# Patient Record
Sex: Male | Born: 1960 | Race: Black or African American | Hispanic: No | Marital: Married | Smoking: Never smoker
Health system: Southern US, Community
[De-identification: ages and names within clinical notes are randomized; demographics above are authoritative.]

## PROBLEM LIST (undated history)

## (undated) DIAGNOSIS — B019 Varicella without complication: Secondary | ICD-10-CM

## (undated) DIAGNOSIS — I2510 Atherosclerotic heart disease of native coronary artery without angina pectoris: Secondary | ICD-10-CM

## (undated) DIAGNOSIS — M542 Cervicalgia: Secondary | ICD-10-CM

## (undated) DIAGNOSIS — E7800 Pure hypercholesterolemia, unspecified: Secondary | ICD-10-CM

## (undated) DIAGNOSIS — I10 Essential (primary) hypertension: Secondary | ICD-10-CM

## (undated) HISTORY — PX: OTHER SURGICAL HISTORY: SHX169

## (undated) HISTORY — PX: BYPASS GRAFT: SHX909

## (undated) HISTORY — PX: CORONARY ARTERY BYPASS GRAFT: SHX141

---

## 2004-01-22 ENCOUNTER — Other Ambulatory Visit: Payer: Self-pay

## 2004-05-16 ENCOUNTER — Emergency Department: Payer: Self-pay | Admitting: Emergency Medicine

## 2007-09-29 ENCOUNTER — Ambulatory Visit: Payer: Self-pay | Admitting: Cardiology

## 2007-12-30 ENCOUNTER — Encounter: Payer: Self-pay | Admitting: Internal Medicine

## 2008-01-13 ENCOUNTER — Encounter: Payer: Self-pay | Admitting: Internal Medicine

## 2008-02-13 ENCOUNTER — Encounter: Payer: Self-pay | Admitting: Internal Medicine

## 2008-03-15 ENCOUNTER — Encounter: Payer: Self-pay | Admitting: Internal Medicine

## 2008-04-14 ENCOUNTER — Encounter: Payer: Self-pay | Admitting: Internal Medicine

## 2008-06-22 ENCOUNTER — Ambulatory Visit: Payer: Self-pay | Admitting: Specialist

## 2008-06-30 ENCOUNTER — Ambulatory Visit: Payer: Self-pay | Admitting: Specialist

## 2010-01-19 ENCOUNTER — Emergency Department: Payer: Self-pay | Admitting: Emergency Medicine

## 2010-03-25 ENCOUNTER — Emergency Department: Payer: Self-pay | Admitting: Unknown Physician Specialty

## 2010-04-20 ENCOUNTER — Ambulatory Visit: Payer: Self-pay | Admitting: Gastroenterology

## 2010-04-26 LAB — PATHOLOGY REPORT

## 2011-09-05 ENCOUNTER — Emergency Department: Payer: Self-pay | Admitting: Emergency Medicine

## 2011-09-05 LAB — BASIC METABOLIC PANEL
Anion Gap: 11 (ref 7–16)
BUN: 17 mg/dL (ref 7–18)
Chloride: 107 mmol/L (ref 98–107)
Creatinine: 1.64 mg/dL — ABNORMAL HIGH (ref 0.60–1.30)
EGFR (African American): 58 — ABNORMAL LOW
EGFR (Non-African Amer.): 48 — ABNORMAL LOW
Glucose: 105 mg/dL — ABNORMAL HIGH (ref 65–99)
Osmolality: 289 (ref 275–301)
Potassium: 3.7 mmol/L (ref 3.5–5.1)

## 2011-09-05 LAB — CBC
HGB: 13.8 g/dL (ref 13.0–18.0)
MCV: 88 fL (ref 80–100)
Platelet: 275 10*3/uL (ref 150–440)
RBC: 4.67 10*6/uL (ref 4.40–5.90)
WBC: 7.1 10*3/uL (ref 3.8–10.6)

## 2011-09-06 LAB — CK TOTAL AND CKMB (NOT AT ARMC)
CK, Total: 450 U/L — ABNORMAL HIGH (ref 35–232)
CK-MB: 1.2 ng/mL (ref 0.5–3.6)

## 2011-09-06 LAB — TROPONIN I: Troponin-I: <0.02 ng/mL

## 2013-07-23 ENCOUNTER — Emergency Department: Payer: Self-pay | Admitting: Emergency Medicine

## 2014-01-09 ENCOUNTER — Emergency Department: Payer: Self-pay | Admitting: Emergency Medicine

## 2014-02-07 ENCOUNTER — Emergency Department: Payer: Self-pay | Admitting: Emergency Medicine

## 2014-05-01 ENCOUNTER — Emergency Department: Payer: Self-pay | Admitting: Emergency Medicine

## 2014-08-17 ENCOUNTER — Emergency Department: Payer: Self-pay | Admitting: Emergency Medicine

## 2014-08-17 LAB — CBC
HCT: 41.7 % (ref 40.0–52.0)
HGB: 13.9 g/dL (ref 13.0–18.0)
MCH: 29.1 pg (ref 26.0–34.0)
MCHC: 33.3 g/dL (ref 32.0–36.0)
MCV: 88 fL (ref 80–100)
PLATELETS: 255 10*3/uL (ref 150–440)
RBC: 4.76 10*6/uL (ref 4.40–5.90)
RDW: 14.6 % — ABNORMAL HIGH (ref 11.5–14.5)
WBC: 5.7 10*3/uL (ref 3.8–10.6)

## 2014-08-17 LAB — COMPREHENSIVE METABOLIC PANEL
ALBUMIN: 3.7 g/dL (ref 3.4–5.0)
Alkaline Phosphatase: 58 U/L (ref 46–116)
Anion Gap: 6 — ABNORMAL LOW (ref 7–16)
BUN: 21 mg/dL — AB (ref 7–18)
Bilirubin,Total: 0.4 mg/dL (ref 0.2–1.0)
Calcium, Total: 8.7 mg/dL (ref 8.5–10.1)
Chloride: 106 mmol/L (ref 98–107)
Co2: 29 mmol/L (ref 21–32)
Creatinine: 1.68 mg/dL — ABNORMAL HIGH (ref 0.60–1.30)
EGFR (African American): 56 — ABNORMAL LOW
EGFR (Non-African Amer.): 46 — ABNORMAL LOW
GLUCOSE: 99 mg/dL (ref 65–99)
OSMOLALITY: 284 (ref 275–301)
Potassium: 3.8 mmol/L (ref 3.5–5.1)
SGOT(AST): 21 U/L (ref 15–37)
SGPT (ALT): 21 U/L (ref 14–63)
Sodium: 141 mmol/L (ref 136–145)
TOTAL PROTEIN: 7.3 g/dL (ref 6.4–8.2)

## 2015-01-27 ENCOUNTER — Emergency Department: Payer: Self-pay

## 2015-01-27 ENCOUNTER — Emergency Department
Admission: EM | Admit: 2015-01-27 | Discharge: 2015-01-27 | Disposition: A | Discharge status: Home / Self Care | Payer: Self-pay | Attending: Emergency Medicine | Admitting: Emergency Medicine

## 2015-01-27 DIAGNOSIS — Y9289 Other specified places as the place of occurrence of the external cause: Secondary | ICD-10-CM | POA: Insufficient documentation

## 2015-01-27 DIAGNOSIS — W228XXA Striking against or struck by other objects, initial encounter: Secondary | ICD-10-CM | POA: Insufficient documentation

## 2015-01-27 DIAGNOSIS — Y9389 Activity, other specified: Secondary | ICD-10-CM | POA: Insufficient documentation

## 2015-01-27 DIAGNOSIS — Y990 Civilian activity done for income or pay: Secondary | ICD-10-CM | POA: Insufficient documentation

## 2015-01-27 DIAGNOSIS — M7032 Other bursitis of elbow, left elbow: Secondary | ICD-10-CM | POA: Insufficient documentation

## 2015-01-27 DIAGNOSIS — M7150 Other bursitis, not elsewhere classified, unspecified site: Secondary | ICD-10-CM

## 2015-01-27 MED ORDER — IBUPROFEN 800 MG PO TABS
800.0000 mg | ORAL_TABLET | Freq: Once | ORAL | Status: AC
  Administered 2015-01-27: 800 mg via ORAL
  Filled 2015-01-27: qty 1

## 2015-01-27 MED ORDER — LIDOCAINE-EPINEPHRINE (PF) 1 %-1:200000 IJ SOLN
30.0000 mL | Freq: Once | INTRAMUSCULAR | Status: AC
  Administered 2015-01-27: 30 mL
  Filled 2015-01-27: qty 30

## 2015-01-27 MED ORDER — OXYCODONE-ACETAMINOPHEN 5-325 MG PO TABS: 1.0000 | ORAL_TABLET | Freq: Four times a day (QID) | ORAL | Status: DC | PRN

## 2015-01-27 NOTE — Discharge Instructions (Signed)
Bursitis °Bursitis is a swelling and soreness (inflammation) of a fluid-filled sac (bursa) that overlies and protects a joint. It can be caused by injury, overuse of the joint, arthritis or infection. The joints most likely to be affected are the elbows, shoulders, hips and knees. °HOME CARE INSTRUCTIONS  °· Apply ice to the affected area for 15-20 minutes each hour while awake for 2 days. Put the ice in a plastic bag and place a towel between the bag of ice and your skin. °· Rest the injured joint as much as possible, but continue to put the joint through a full range of motion, 4 times per day. (The shoulder joint especially becomes rapidly "frozen" if not used.) When the pain lessens, begin normal slow movements and usual activities. °· Only take over-the-counter or prescription medicines for pain, discomfort or fever as directed by your caregiver. °· Your caregiver may recommend draining the bursa and injecting medicine into the bursa. This may help the healing process. °· Follow all instructions for follow-up with your caregiver. This includes any orthopedic referrals, physical therapy and rehabilitation. Any delay in obtaining necessary care could result in a delay or failure of the bursitis to heal and chronic pain. °SEEK IMMEDIATE MEDICAL CARE IF:  °· Your pain increases even during treatment. °· You develop an oral temperature above 102° F (38.9° C) and have heat and inflammation over the involved bursa. °MAKE SURE YOU:  °· Understand these instructions. °· Will watch your condition. °· Will get help right away if you are not doing well or get worse. °Document Released: 06/28/2000 Document Revised: 09/23/2011 Document Reviewed: 09/20/2013 °[employer]® Patient Information ©2015 [employer]. This information is not intended to replace advice given to you by your health care provider. Make sure you discuss any questions you have with your health care provider. ° °

## 2015-01-27 NOTE — ED Provider Notes (Signed)
[facility] Emergency Department Provider Note ____________________________________________  Time seen: Approximately  5:35 PM  I have reviewed the triage vital signs and the nursing notes.   HISTORY  Chief Complaint Elbow Pain   HPI Mayer CamelKerry F Scheffel is a 54 y.o. male who presents to the emergency department for left elbow pain that started after he hit it on something while at work. He states the pain and swelling has increased over the past 2 days. It feels "tight" and hurts to bend it or for anyone to just touch it.   History reviewed. No pertinent past medical history.  There are no active problems to display for this patient.   History reviewed. No pertinent past surgical history.  Current Outpatient Rx  Name  Route  Sig  Dispense  Refill  . oxyCODONE-acetaminophen (ROXICET) 5-325 MG per tablet   Oral   Take 1 tablet by mouth every 6 (six) hours as needed.   12 tablet   0     Allergies Review of patient's allergies indicates no known allergies.  No family history on file.  Social History History  Substance Use Topics  . Smoking status: Never Smoker   . Smokeless tobacco: Never Used  . Alcohol Use: No    Review of Systems Constitutional: No recent illness. Eyes: No visual changes. ENT: No sore throat. Cardiovascular: Denies chest pain or palpitations. Respiratory: Denies shortness of breath. Gastrointestinal: No abdominal pain.  Genitourinary: Negative for dysuria. Musculoskeletal: Pain in left elbow. Skin: Negative for rash. Neurological: Negative for headaches, focal weakness or numbness. 10-point ROS otherwise negative.  ____________________________________________   PHYSICAL EXAM:  VITAL SIGNS: ED Triage Vitals  Enc Vitals Group     BP 01/27/15 1724 131/86 mmHg     Pulse Rate 01/27/15 1724 87     Resp 01/27/15 1724 18     Temp 01/27/15 1724 98.3 F (36.8 C)     Temp Source 01/27/15 1724 Oral     SpO2 01/27/15  1724 98 %     Weight 01/27/15 1714 340 lb (154.223 kg)     Height 01/27/15 1714 6\' 2"  (1.88 m)     Head Cir --      Peak Flow --      Pain Score 01/27/15 1715 7     Pain Loc --      Pain Edu? --      Excl. in GC? --     Constitutional: Alert and oriented. Well appearing and in no acute distress. Eyes: Conjunctivae are normal. EOMI. Head: Atraumatic. Nose: No congestion/rhinnorhea. Neck: No stridor.  Respiratory: Normal respiratory effort.   Musculoskeletal: Limited ROM of left elbow due to pain. Boggy bursa present without erythema or increased skin temp. Neurologic:  Normal speech and language. No gross focal neurologic deficits are appreciated. Speech is normal. No gait instability. Skin:  Skin is warm, dry and intact. Atraumatic. No erythema over bursa. Psychiatric: Mood and affect are normal. Speech and behavior are normal.  ____________________________________________   LABS (all labs ordered are listed, but only abnormal results are displayed)  Labs Reviewed - No data to display ____________________________________________  RADIOLOGY X-ray viewed by me. Olecranon spur noted. No acute abnormality ____________________________________________   PROCEDURES  Procedure(s) performed:  After consent was obtained, using sterile technique the left elbow bursa was prepped and  Lidocaine 1% with epinephrine was used as local anesthetic. The bursa was entered and 15 ml's of serosanguaneous colored fluid was withdrawn.  The procedure was well  tolerated.   Watch for fever, or increased swelling or persistent pain in the joint.  ____________________________________________   INITIAL IMPRESSION / ASSESSMENT AND PLAN / ED COURSE  Pertinent labs & imaging results that were available during my care of the patient were reviewed by me and considered in my medical decision making (see chart for details).  Patient was advised to follow up with orthopedics or return to the emergency  department for concerns. He was advised to limit movement of the joint for the next few days. He was advised to return immediately for any sign or concern of infection. ____________________________________________   FINAL CLINICAL IMPRESSION(S) / ED DIAGNOSES  Final diagnoses:  Bursitis due to trauma       Chinita Pester, FNP 01/27/15 1913  Darien Ramus, MD 01/27/15 [phone]

## 2015-01-27 NOTE — ED Notes (Signed)
Pt states he hit he left elbow at work 2 days , states he works at [employer]

## 2015-05-31 ENCOUNTER — Other Ambulatory Visit: Payer: Self-pay | Admitting: Orthopedic Surgery

## 2015-05-31 DIAGNOSIS — M25561 Pain in right knee: Secondary | ICD-10-CM

## 2015-05-31 DIAGNOSIS — M1711 Unilateral primary osteoarthritis, right knee: Secondary | ICD-10-CM

## 2015-06-16 ENCOUNTER — Ambulatory Visit: Admission: RE | Admit: 2015-06-16 | Payer: BLUE CROSS/BLUE SHIELD | Source: Ambulatory Visit

## 2015-06-30 ENCOUNTER — Encounter: Payer: Self-pay | Admitting: *Deleted

## 2015-06-30 ENCOUNTER — Emergency Department
Admission: EM | Admit: 2015-06-30 | Discharge: 2015-07-01 | Disposition: A | Discharge status: Home / Self Care | Payer: BLUE CROSS/BLUE SHIELD | Attending: Emergency Medicine | Admitting: Emergency Medicine

## 2015-06-30 DIAGNOSIS — Y9289 Other specified places as the place of occurrence of the external cause: Secondary | ICD-10-CM | POA: Insufficient documentation

## 2015-06-30 DIAGNOSIS — Z79899 Other long term (current) drug therapy: Secondary | ICD-10-CM | POA: Diagnosis not present

## 2015-06-30 DIAGNOSIS — Y9389 Activity, other specified: Secondary | ICD-10-CM | POA: Diagnosis not present

## 2015-06-30 DIAGNOSIS — N189 Chronic kidney disease, unspecified: Secondary | ICD-10-CM | POA: Diagnosis not present

## 2015-06-30 DIAGNOSIS — S8992XA Unspecified injury of left lower leg, initial encounter: Secondary | ICD-10-CM | POA: Diagnosis present

## 2015-06-30 DIAGNOSIS — Y998 Other external cause status: Secondary | ICD-10-CM | POA: Insufficient documentation

## 2015-06-30 DIAGNOSIS — X58XXXA Exposure to other specified factors, initial encounter: Secondary | ICD-10-CM | POA: Insufficient documentation

## 2015-06-30 DIAGNOSIS — S86812A Strain of other muscle(s) and tendon(s) at lower leg level, left leg, initial encounter: Secondary | ICD-10-CM | POA: Insufficient documentation

## 2015-06-30 DIAGNOSIS — S86912A Strain of unspecified muscle(s) and tendon(s) at lower leg level, left leg, initial encounter: Secondary | ICD-10-CM

## 2015-06-30 LAB — FIBRIN DERIVATIVES D-DIMER (ARMC ONLY): Fibrin derivatives D-dimer (ARMC): 341 (ref 0–499)

## 2015-06-30 NOTE — ED Notes (Signed)
Pt reports sudden onset of left leg pain beginning yesterday. Pt is able to move leg but reports increased pain with doing so as well as increased pain when bearing weight. Pt denies trauma to the leg. 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 Increased tenderness behind knee upon palpation.  Pedal pulse present and strong. Color of leg appropriate.

## 2015-07-01 LAB — BASIC METABOLIC PANEL
Anion gap: 8 (ref 5–15)
BUN: 24 mg/dL — ABNORMAL HIGH (ref 6–20)
CALCIUM: 9.3 mg/dL (ref 8.9–10.3)
CO2: 26 mmol/L (ref 22–32)
CREATININE: 1.86 mg/dL — AB (ref 0.61–1.24)
Chloride: 107 mmol/L (ref 101–111)
GFR calc non Af Amer: 40 mL/min — ABNORMAL LOW (ref >60)
GFR, EST AFRICAN AMERICAN: 46 mL/min — AB (ref >60)
Glucose, Bld: 135 mg/dL — ABNORMAL HIGH (ref 65–99)
Potassium: 3.8 mmol/L (ref 3.5–5.1)
Sodium: 141 mmol/L (ref 135–145)

## 2015-07-01 LAB — CK: Total CK: 527 U/L — ABNORMAL HIGH (ref 49–397)

## 2015-07-01 MED ORDER — ACETAMINOPHEN 500 MG PO TABS
1000.0000 mg | ORAL_TABLET | Freq: Once | ORAL | Status: AC
  Administered 2015-07-01: 1000 mg via ORAL
  Filled 2015-07-01: qty 2

## 2015-07-01 MED ORDER — HYDROCODONE-ACETAMINOPHEN 5-325 MG PO TABS: 1.0000 | ORAL_TABLET | ORAL | Status: DC | PRN

## 2015-07-01 MED ORDER — DOCUSATE SODIUM 100 MG PO CAPS: ORAL_CAPSULE | ORAL | Status: DC

## 2015-07-01 NOTE — ED Notes (Signed)
Pt. Going home with family. 

## 2015-07-01 NOTE — ED Notes (Signed)
Pt reports sudden onset of pain in left lateral and posterior leg beginning yesterday evening.  Pt describes the pain as a soreness.  Pt denies any injury to the area.  Pt reports he did do more walking then normal yesterday.  Patient rates the pain at an 8 out of 10.  Pt reports a hx of a bypass surgery in 2009 - he reports that they removed an artery from the left leg to use in that surgery.  There is mild swelling on the medial knee, with no warmth or redness.

## 2015-07-01 NOTE — ED Provider Notes (Signed)
[facility] Emergency Department Provider Note  ____________________________________________  Time seen: Approximately 12:23 AM  I have reviewed the triage vital signs and the nursing notes.   HISTORY  Chief Complaint Leg Pain    HPI Greg Lowe is a 53 y.o. male with past medical history that includes a bypass graft with a vein harvest from the left leg approximately 7 years ago.  He presents with rapid onset of pain in his left lower extremity starting yesterday.  He describes the pain as a severe ache that starts just above his knee on the anterior aspect of it and wraps around to the back side of the thigh, pain behind the left knee, and less pain in the calf.  He describes it is the way your muscle feels sore after working out.  He said that he has been working out recently but it is only his left leg is sore and not his right.  He has not had any numbness or tingling in the extremity and and it is the same color and not swollen.  The pain is worse with movement and with weightbearing and slightly better with rest.  He denies fever/chills, chest pain, shortness of breath, abdominal pain, nausea/vomiting.   History reviewed. No pertinent past medical history.  There are no active problems to display for this patient.   Past Surgical History  Procedure Laterality Date  . Bypass graft      triple    Current Outpatient Rx  Name  Route  Sig  Dispense  Refill  . rosuvastatin (CRESTOR) 10 MG tablet   Oral   Take 10 mg by mouth daily.         Marland Kitchen docusate sodium (COLACE) 100 MG capsule      Take 1 tablet once or twice daily as needed for constipation while taking narcotic pain medicine   30 capsule   0   . HYDROcodone-acetaminophen (NORCO/VICODIN) 5-325 MG tablet   Oral   Take 1-2 tablets by mouth every 4 (four) hours as needed for moderate pain.   15 tablet   0   . oxyCODONE-acetaminophen (ROXICET) 5-325 MG per tablet   Oral   Take 1 tablet  by mouth every 6 (six) hours as needed.   12 tablet   0     Allergies Review of patient's allergies indicates no known allergies.  History reviewed. No pertinent family history.  Social History Social History  Substance Use Topics  . Smoking status: Never Smoker   . Smokeless tobacco: Never Used  . Alcohol Use: No    Review of Systems Constitutional: No fever/chills Eyes: No visual changes. ENT: No sore throat. Cardiovascular: Denies chest pain. Respiratory: Denies shortness of breath. Gastrointestinal: No abdominal pain.  No nausea, no vomiting.  No diarrhea.  No constipation. Genitourinary: Negative for dysuria. Musculoskeletal: Muscle pain in the left distal thigh, anterior and posterior, pain behind the left knee, and in the left calf. Skin: Negative for rash. Neurological: Negative for headaches, focal weakness or numbness.  10-point ROS otherwise negative.  ____________________________________________   PHYSICAL EXAM:  VITAL SIGNS: ED Triage Vitals  Enc Vitals Group     BP 06/30/15 2205 149/88 mmHg     Pulse Rate 06/30/15 2205 68     Resp 06/30/15 2205 16     Temp 06/30/15 2205 98.1 F (36.7 C)     Temp Source 06/30/15 2205 Oral     SpO2 06/30/15 2205 96 %  Weight 06/30/15 2205 350 lb (158.759 kg)     Height 06/30/15 2205 6' 1.5" (1.867 m)     Head Cir --      Peak Flow --      Pain Score 06/30/15 2205 9     Pain Loc --      Pain Edu? --      Excl. in GC? --     Constitutional: Alert and oriented. Well appearing and in no acute distress. Eyes: Conjunctivae are normal. PERRL. EOMI. Head: Atraumatic. Nose: No congestion/rhinnorhea. Mouth/Throat: Mucous membranes are moist.  Oropharynx non-erythematous. Neck: No stridor.   Cardiovascular: Normal rate, regular rhythm. Grossly normal heart sounds.  Good peripheral circulation.  Normal distal pulses in bilateral feet. Respiratory: Normal respiratory effort.  No retractions. Lungs  CTAB. Gastrointestinal: Soft and nontender. No distention. No abdominal bruits. No CVA tenderness. Musculoskeletal: The patient has moderate to severe tenderness of the left anterior thigh muscles and the posterior thigh as well as the popliteal fossa.  There is mild tenderness to the left calf.  There is no appreciable swelling of the left lower extremity, no skin color changes, and the extremity is warm. Neurologic:  Normal speech and language. No gross focal neurologic deficits are appreciated including in the affected extremity. Skin:  Skin is warm, dry and intact. No rash noted. Psychiatric: Mood and affect are normal. Speech and behavior are normal.  ____________________________________________   LABS (all labs ordered are listed, but only abnormal results are displayed)  Labs Reviewed  BASIC METABOLIC PANEL - Abnormal; Notable for the following:    Glucose, Bld 135 (*)    BUN 24 (*)    Creatinine, Ser 1.86 (*)    GFR calc non Af Amer 40 (*)    GFR calc Af Amer 46 (*)    All other components within normal limits  CK - Abnormal; Notable for the following:    Total CK 527 (*)    All other components within normal limits  FIBRIN DERIVATIVES D-DIMER [facility] ONLY)   ____________________________________________  EKG  ED ECG REPORT I, Greg Lowe, the attending physician, personally viewed and interpreted this ECG.  Date: 07/01/2015 EKG Time: 02:09 Rate: 69 Rhythm: normal sinus rhythm QRS Axis: normal Intervals: normal ST/T Wave abnormalities: normal Conduction Disutrbances: none Narrative Interpretation: unremarkable  ____________________________________________  RADIOLOGY   No results found.  ____________________________________________   PROCEDURES  Procedure(s) performed: None  Critical Care performed: No ____________________________________________   INITIAL IMPRESSION / ASSESSMENT AND PLAN / ED COURSE  Pertinent labs & imaging results that were  available during my care of the patient were reviewed by me and considered in my medical decision making (see chart for details).  The patient is at low risk for DVT and a d-dimer that was obtained while the patient was waiting for an exam room which is within normal limits virtually excludes the possibility of a DVT.  There is no evidence or indication to support an arterial occlusion, aneurysm, or other emergent arterial issue in the left lower extremity.  Common things being common this is most likely musculoskeletal pain/strain.  I will obtain a BMP and a CK, but there is no specific imaging would be helpful at this time other than possibly a CT iliofemoral runoff study, but given the unlikelihood of this being an arterial issue I feel that the risks of the patient's kidneys and the radiation burden is higher than the potential benefits.  I will reassess after the patient's labs come back.  I cannot give the patient any narcotics because he drove himself but I am giving him 1 g of Tylenol.  ----------------------------------------- 3:28 AM on 07/01/2015 -----------------------------------------  The patient has been sleeping comfortably for the last couple of hours.  He does have some chronic renal disease but his creatinine is not significantly elevated over baseline for him.  His CK is slightly elevated which lends credence to the idea of a muscle strain from his recent workouts, but he does not come anywhere close to qualifying for rhabdomyolysis.  I discussed the results with the patient and encouraged him to drink plenty of by mouth fluids and to take over-the-counter Tylenol as needed for his pain.  I will give him a prescription for Norco for severe pain and I gave him the usual precautions.  I encouraged him to follow up with his PCP on Monday.  I gave my usual and customary return precautions.     ____________________________________________  FINAL CLINICAL IMPRESSION(S) / ED  DIAGNOSES  Final diagnoses:  Muscle strain of left lower leg, initial encounter  Chronic renal insufficiency, unspecified stage      NEW MEDICATIONS STARTED DURING THIS VISIT:  New Prescriptions   DOCUSATE SODIUM (COLACE) 100 MG CAPSULE    Take 1 tablet once or twice daily as needed for constipation while taking narcotic pain medicine   HYDROCODONE-ACETAMINOPHEN (NORCO/VICODIN) 5-325 MG TABLET    Take 1-2 tablets by mouth every 4 (four) hours as needed for moderate pain.     Loleta Roseory Jacora Hopkins, MD 07/01/15 [phone]

## 2015-07-01 NOTE — Discharge Instructions (Signed)
As we discussed, the majority of your workup was reassuring, and we believe that your pain is likely result of muscle strain.  Please use over-the-counter Tylenol and follow up with your primary care doctor on Monday to schedule the next available follow-up appointment.  Take Norco as prescribed for severe pain. Do not drink alcohol, drive or participate in any other potentially dangerous activities while taking this medication as it may make you sleepy. Do not take this medication with any other sedating medications, either prescription or over-the-counter. If you were prescribed Percocet or Vicodin, do not take these with acetaminophen (Tylenol) as it is already contained within these medications.   This medication is an opiate (or narcotic) pain medication and can be habit forming.  Use it as little as possible to achieve adequate pain control.  Do not use or use it with extreme caution if you have a history of opiate abuse or dependence.  If you are on a pain contract with your primary care doctor or a pain specialist, be sure to let them know you were prescribed this medication today from the [facility].  This medication is intended for your use only - do not give any to anyone else and keep it in a secure place where nobody else, especially children, have access to it.  It will also cause or worsen constipation, so you may want to consider taking an over-the-counter stool softener while you are taking this medication.  As we also discussed, please drink plenty of clear fluids such as water or Gatorade to stay hydrated.  You have a degree of chronic kidney disease and the need to make sure have plenty of fluids to keep them healthy.  Return to the emergency department with new or worsening symptoms that concern you.   Muscle Strain A muscle strain is an injury that occurs when a muscle is stretched beyond its normal length. Usually a small number of muscle fibers are torn  when this happens. Muscle strain is rated in degrees. First-degree strains have the least amount of muscle fiber tearing and pain. Second-degree and third-degree strains have increasingly more tearing and pain.  Usually, recovery from muscle strain takes 1-2 weeks. Complete healing takes 5-6 weeks.  CAUSES  Muscle strain happens when a sudden, violent force placed on a muscle stretches it too far. This may occur with lifting, sports, or a fall.  RISK FACTORS Muscle strain is especially common in athletes.  SIGNS AND SYMPTOMS At the site of the muscle strain, there may be:  Pain.  Bruising.  Swelling.  Difficulty using the muscle due to pain or lack of normal function. DIAGNOSIS  Your health care provider will perform a physical exam and ask about your medical history. TREATMENT  Often, the best treatment for a muscle strain is resting, icing, and applying cold compresses to the injured area.  HOME CARE INSTRUCTIONS   Use the PRICE method of treatment to promote muscle healing during the first 2-3 days after your injury. The PRICE method involves:  Protecting the muscle from being injured again.  Restricting your activity and resting the injured body part.  Icing your injury. To do this, put ice in a plastic bag. Place a towel between your skin and the bag. Then, apply the ice and leave it on from 15-20 minutes each hour. After the third day, switch to moist heat packs.  Apply compression to the injured area with a splint or elastic bandage. Be careful not to  wrap it too tightly. This may interfere with blood circulation or increase swelling.  Elevate the injured body part above the level of your heart as often as you can.  Only take over-the-counter or prescription medicines for pain, discomfort, or fever as directed by your health care provider.  Warming up prior to exercise helps to prevent future muscle strains. SEEK MEDICAL CARE IF:   You have increasing pain or swelling  in the injured area.  You have numbness, tingling, or a significant loss of strength in the injured area. MAKE SURE YOU:   Understand these instructions.  Will watch your condition.  Will get help right away if you are not doing well or get worse.   This information is not intended to replace advice given to you by your health care provider. Make sure you discuss any questions you have with your health care provider.   Document Released: 07/01/2005 Document Revised: 04/21/2013 Document Reviewed: 01/28/2013 Elsevier Interactive Patient Education 2016 [employer]  Chronic Kidney Disease Chronic kidney disease occurs when the kidneys are damaged over a long period. The kidneys are two organs that lie on either side of the spine between the middle of the back and the front of the abdomen. The kidneys:  Remove wastes and extra water from the blood.  Produce important hormones. These help keep bones strong, regulate blood pressure, and help create red blood cells.  Balance the fluids and chemicals in the blood and tissues. A small amount of kidney damage may not cause problems, but a large amount of damage may make it difficult or impossible for the kidneys to work the way they should. If steps are not taken to slow down the kidney damage or stop it from getting worse, the kidneys may stop working permanently. Most of the time, chronic kidney disease does not go away. However, it can often be controlled, and those with the disease can usually live normal lives. CAUSES The most common causes of chronic kidney disease are diabetes and high blood pressure (hypertension). Chronic kidney disease may also be caused by:  Diseases that cause the kidneys' filters to become inflamed.  Diseases that affect the immune system.  Genetic diseases.  Medicines that damage the kidneys, such as anti-inflammatory medicines.  Poisoning or exposure to toxic substances.  A reoccurring kidney or urinary  infection.  A problem with urine flow. This may be caused by:  Cancer.  Kidney stones.  An enlarged prostate in males. SIGNS AND SYMPTOMS Because the kidney damage in chronic kidney disease occurs slowly, symptoms develop slowly and may not be obvious until the kidney damage becomes severe. A person may have a kidney disease for years without showing any symptoms. Symptoms can include:  Swelling (edema) of the legs, ankles, or feet.  Tiredness (lethargy).  Nausea or vomiting.  Confusion.  Problems with urination, such as:  Decreased urine production.  Frequent urination, especially at night.  Frequent accidents in children who are potty trained.  Muscle twitches and cramps.  Shortness of breath.  Weakness.  Persistent itchiness.  Loss of appetite.  Metallic taste in the mouth.  Trouble sleeping.  Slowed development in children.  Short stature in children. DIAGNOSIS Chronic kidney disease may be detected and diagnosed by tests, including blood, urine, imaging, or kidney biopsy tests. TREATMENT Most chronic kidney diseases cannot be cured. Treatment usually involves relieving symptoms and preventing or slowing the progression of the disease. Treatment may include:  A special diet. You may need to avoid  alcohol and foods thatare salty and high in potassium.  Medicines. These may:  Lower blood pressure.  Relieve anemia.  Relieve swelling.  Protect the bones. HOME CARE INSTRUCTIONS  Follow your prescribed diet. Your health care provider may instruct you to limit daily salt (sodium) and protein intake.  Take medicines only as directed by your health care provider. Do not take any new medicines (prescription, over-the-counter, or nutritional supplements) unless approved by your health care provider. Many medicines can worsen your kidney damage or need to have the dose adjusted.   Quit smoking if you smoke. Talk to your health care provider about a  smoking cessation program.  Keep all follow-up visits as directed by your health care provider.  Monitor your blood pressure.  Start or continue an exercise plan.  Get immunizations as directed by your health care provider.  Take vitamin and mineral supplements as directed by your health care provider. SEEK IMMEDIATE MEDICAL CARE IF:  Your symptoms get worse or you develop new symptoms.  You develop symptoms of end-stage kidney disease. These include:  Headaches.  Abnormally dark or light skin.  Numbness in the hands or feet.  Easy bruising.  Frequent hiccups.  Menstruation stops.  You have a fever.  You have decreased urine production.  You havepain or bleeding when urinating. MAKE SURE YOU:  Understand these instructions.  Will watch your condition.  Will get help right away if you are not doing well or get worse. FOR MORE INFORMATION   American Association of Kidney Patients: ResidentialShow.is  National Kidney Foundation: www.kidney.org  American Kidney Fund: FightingMatch.com.ee  [employer] [facility]: www.lifeoptions.org and www.kidneyschool.org   This information is not intended to replace advice given to you by your health care provider. Make sure you discuss any questions you have with your health care provider.   Document Released: 04/09/2008 Document Revised: 07/22/2014 Document Reviewed: 02/28/2012 Elsevier Interactive Patient Education [employer]

## 2015-07-24 ENCOUNTER — Encounter: Payer: Self-pay | Admitting: *Deleted

## 2015-07-25 ENCOUNTER — Ambulatory Visit
Admission: RE | Admit: 2015-07-25 | Payer: BLUE CROSS/BLUE SHIELD | Source: Ambulatory Visit | Admitting: Gastroenterology

## 2015-07-25 ENCOUNTER — Encounter: Admission: RE | Payer: Self-pay | Source: Ambulatory Visit

## 2015-07-25 HISTORY — DX: Essential (primary) hypertension: I10

## 2015-07-25 HISTORY — DX: Atherosclerotic heart disease of native coronary artery without angina pectoris: I25.10

## 2015-07-25 HISTORY — DX: Varicella without complication: B01.9

## 2015-07-25 HISTORY — DX: Pure hypercholesterolemia, unspecified: E78.00

## 2015-07-25 HISTORY — DX: Cervicalgia: M54.2

## 2015-07-25 SURGERY — COLONOSCOPY WITH PROPOFOL
Anesthesia: General

## 2015-07-28 ENCOUNTER — Other Ambulatory Visit: Payer: Self-pay | Admitting: Internal Medicine

## 2015-07-28 DIAGNOSIS — N183 Chronic kidney disease, stage 3 unspecified: Secondary | ICD-10-CM

## 2015-08-03 ENCOUNTER — Ambulatory Visit: Admission: RE | Admit: 2015-08-03 | Payer: BLUE CROSS/BLUE SHIELD | Source: Ambulatory Visit

## 2015-08-03 ENCOUNTER — Other Ambulatory Visit: Payer: Self-pay | Admitting: Orthopedic Surgery

## 2015-08-03 DIAGNOSIS — M25561 Pain in right knee: Secondary | ICD-10-CM

## 2015-08-03 DIAGNOSIS — M1711 Unilateral primary osteoarthritis, right knee: Secondary | ICD-10-CM

## 2015-08-23 ENCOUNTER — Ambulatory Visit
Admission: RE | Admit: 2015-08-23 | Discharge: 2015-08-23 | Disposition: A | Discharge status: Home / Self Care | Payer: BLUE CROSS/BLUE SHIELD | Source: Ambulatory Visit | Attending: Orthopedic Surgery | Admitting: Orthopedic Surgery

## 2015-08-23 DIAGNOSIS — M1711 Unilateral primary osteoarthritis, right knee: Secondary | ICD-10-CM | POA: Insufficient documentation

## 2015-08-23 DIAGNOSIS — M25561 Pain in right knee: Secondary | ICD-10-CM | POA: Diagnosis present

## 2015-09-18 ENCOUNTER — Encounter: Payer: Self-pay | Admitting: *Deleted

## 2015-09-19 ENCOUNTER — Ambulatory Visit: Payer: BLUE CROSS/BLUE SHIELD | Admitting: Anesthesiology

## 2015-09-19 ENCOUNTER — Encounter: Payer: Self-pay | Admitting: *Deleted

## 2015-09-19 ENCOUNTER — Encounter
Admission: RE | Disposition: A | Discharge status: Home / Self Care | Payer: Self-pay | Source: Ambulatory Visit | Attending: Gastroenterology

## 2015-09-19 ENCOUNTER — Ambulatory Visit
Admission: RE | Admit: 2015-09-19 | Discharge: 2015-09-19 | Disposition: A | Discharge status: Home / Self Care | Payer: BLUE CROSS/BLUE SHIELD | Source: Ambulatory Visit | Attending: Gastroenterology | Admitting: Gastroenterology

## 2015-09-19 DIAGNOSIS — E7800 Pure hypercholesterolemia, unspecified: Secondary | ICD-10-CM | POA: Diagnosis not present

## 2015-09-19 DIAGNOSIS — K5730 Diverticulosis of large intestine without perforation or abscess without bleeding: Secondary | ICD-10-CM | POA: Insufficient documentation

## 2015-09-19 DIAGNOSIS — M542 Cervicalgia: Secondary | ICD-10-CM | POA: Diagnosis not present

## 2015-09-19 DIAGNOSIS — Z7982 Long term (current) use of aspirin: Secondary | ICD-10-CM | POA: Insufficient documentation

## 2015-09-19 DIAGNOSIS — Z86010 Personal history of colonic polyps: Secondary | ICD-10-CM | POA: Diagnosis present

## 2015-09-19 DIAGNOSIS — I10 Essential (primary) hypertension: Secondary | ICD-10-CM | POA: Insufficient documentation

## 2015-09-19 DIAGNOSIS — I2510 Atherosclerotic heart disease of native coronary artery without angina pectoris: Secondary | ICD-10-CM | POA: Diagnosis not present

## 2015-09-19 DIAGNOSIS — Z79899 Other long term (current) drug therapy: Secondary | ICD-10-CM | POA: Diagnosis not present

## 2015-09-19 DIAGNOSIS — Z09 Encounter for follow-up examination after completed treatment for conditions other than malignant neoplasm: Secondary | ICD-10-CM | POA: Insufficient documentation

## 2015-09-19 HISTORY — PX: COLONOSCOPY WITH PROPOFOL: SHX5780

## 2015-09-19 SURGERY — COLONOSCOPY WITH PROPOFOL
Anesthesia: General

## 2015-09-19 MED ORDER — SODIUM CHLORIDE 0.9 % IV SOLN
INTRAVENOUS | Status: DC
Start: 2015-09-19 — End: 2015-09-19

## 2015-09-19 MED ORDER — SODIUM CHLORIDE 0.9 % IV SOLN
INTRAVENOUS | Status: DC
  Administered 2015-09-19: 10:00:00 via INTRAVENOUS

## 2015-09-19 MED ORDER — PROPOFOL 500 MG/50ML IV EMUL
INTRAVENOUS | Status: DC | PRN
  Administered 2015-09-19: 200 ug/kg/min via INTRAVENOUS

## 2015-09-19 MED ORDER — PROPOFOL 10 MG/ML IV BOLUS
INTRAVENOUS | Status: DC | PRN
  Administered 2015-09-19: 100 mg via INTRAVENOUS

## 2015-09-19 MED ORDER — LIDOCAINE HCL (CARDIAC) 20 MG/ML IV SOLN
INTRAVENOUS | Status: DC | PRN
  Administered 2015-09-19: 60 mg via INTRAVENOUS

## 2015-09-19 NOTE — Anesthesia Preprocedure Evaluation (Signed)
Anesthesia Evaluation  Patient identified by MRN, date of birth, ID band Patient awake    Reviewed: Allergy & Precautions, H&P , NPO status , Patient's Chart, lab work & pertinent test results, reviewed documented beta blocker date and time   History of Anesthesia Complications Negative for: history of anesthetic complications  Airway Mallampati: IV  TM Distance: >3 FB Neck ROM: full    Dental no notable dental hx. (+) Missing, Teeth Intact   Pulmonary neg pulmonary ROS,    Pulmonary exam normal breath sounds clear to auscultation       Cardiovascular Exercise Tolerance: Good hypertension, On Medications (-) angina+ CAD and + CABG (3 vessel CABG in 2009)  (-) Past MI and (-) Cardiac Stents Normal cardiovascular exam(-) dysrhythmias (-) Valvular Problems/Murmurs Rhythm:regular Rate:Normal     Neuro/Psych negative neurological ROS  negative psych ROS   GI/Hepatic negative GI ROS, Neg liver ROS,   Endo/Other  neg diabetesMorbid obesity  Renal/GU negative Renal ROS  negative genitourinary   Musculoskeletal   Abdominal   Peds  Hematology negative hematology ROS (+)   Anesthesia Other Findings Past Medical History:   Coronary artery disease                                      Hypertension                                                 High cholesterol                                             Chicken pox                                                  Cervicalgia                                                  Reproductive/Obstetrics negative OB ROS                             Anesthesia Physical Anesthesia Plan  ASA: III  Anesthesia Plan: General   Post-op Pain Management:    Induction:   Airway Management Planned:   Additional Equipment:   Intra-op Plan:   Post-operative Plan:   Informed Consent: I have reviewed the patients History and Physical, chart, labs and  discussed the procedure including the risks, benefits and alternatives for the proposed anesthesia with the patient or authorized representative who has indicated his/her understanding and acceptance.   Dental Advisory Given  Plan Discussed with: Anesthesiologist, CRNA and Surgeon  Anesthesia Plan Comments:         Anesthesia Quick Evaluation

## 2015-09-19 NOTE — Transfer of Care (Signed)
Immediate Anesthesia Transfer of Care Note  Patient: Greg Lowe  Procedure(s) Performed: Procedure(s): COLONOSCOPY WITH PROPOFOL (N/A)  Patient Location: PACU and Endoscopy Unit  Anesthesia Type:General  Level of Consciousness: awake  Airway & Oxygen Therapy: Patient Spontanous Breathing and Patient connected to nasal cannula oxygen  Post-op Assessment: Report given to RN and Post -op Vital signs reviewed and stable  Post vital signs: stable  Last Vitals:  Filed Vitals:   09/19/15 0953 09/19/15 1222  BP: 167/96 120/52  Pulse: 59 61  Temp: 36.6 C 36.3 C  Resp: 20 14    Complications: No apparent anesthesia complications

## 2015-09-19 NOTE — H&P (Signed)
Outpatient short stay form Pre-procedure 09/19/2015 11:37 AM Christena DeemMartin U Skulskie MD  Primary Physician: Dr. Bethann PunchesMark Miller  Reason for visit:  Colonoscopy  History of present illness:  Patient is a 55 year old male presenting today for a colonoscopy. He has a personal history of adenomatous colon polyps. His last colonoscopy was 04/20/2010. He does take a 325 mg aspirin but has held that for 5 days. He takes no other aspirin products or blood thinning agents.    Current facility-administered medications:  .  0.9 %  sodium chloride infusion, , Intravenous, Continuous, Christena DeemMartin U Skulskie, MD, Last Rate: 20 mL/hr at 09/19/15 1023 .  0.9 %  sodium chloride infusion, , Intravenous, Continuous, Christena DeemMartin U Skulskie, MD  Prescriptions prior to admission  Medication Sig Dispense Refill Last Dose  . losartan-hydrochlorothiazide (HYZAAR) 100-25 MG tablet Take 1 tablet by mouth daily.   09/19/2015 at Unknown time  . rosuvastatin (CRESTOR) 10 MG tablet Take 10 mg by mouth daily.   09/19/2015 at Unknown time  . aspirin 325 MG tablet Take 325 mg by mouth daily.     . colchicine 0.6 MG tablet Take 0.6 mg by mouth daily.     Marland Kitchen. docusate sodium (COLACE) 100 MG capsule Take 1 tablet once or twice daily as needed for constipation while taking narcotic pain medicine 30 capsule 0   . HYDROcodone-acetaminophen (NORCO/VICODIN) 5-325 MG tablet Take 1-2 tablets by mouth every 4 (four) hours as needed for moderate pain. 15 tablet 0   . metoprolol succinate (TOPROL-XL) 25 MG 24 hr tablet Take 25 mg by mouth daily. Reported on 09/19/2015   Not Taking at Unknown time  . Multiple Vitamin (MULTIVITAMIN) capsule Take 1 capsule by mouth daily.     Marland Kitchen. oxyCODONE-acetaminophen (ROXICET) 5-325 MG per tablet Take 1 tablet by mouth every 6 (six) hours as needed. 12 tablet 0      No Known Allergies   Past Medical History  Diagnosis Date  . Coronary artery disease   . Hypertension   . High cholesterol   . Chicken pox   . Cervicalgia      Review of systems:      Physical Exam    Heart and lungs: Regular rate and rhythm without rub or gallop, lungs are bilaterally clear.    HEENT: Normocephalic atraumatic eyes are anicteric    Other:     Pertinant exam for procedure: Obese nontender bowel sounds positive is not possible to palpate internal organs.    Planned proceedures: Colonoscopy and indicated procedures. I have discussed the risks benefits and complications of procedures to include not limited to bleeding, infection, perforation and the risk of sedation and the patient wishes to proceed. Christena DeemMartin U Skulskie, MD Gastroenterology 09/19/2015  11:37 AM

## 2015-09-19 NOTE — Op Note (Signed)
[facility] Patient Name: Greg Lowe Record Procedure Date: 09/19/2015 11:51 AM MRN: [id number] Account #: [id number] Date of Birth: 11-Jun-1961 Admit Type: Outpatient Age: 55 Room: [facility] ENDO ROOM 3 Gender: Male Note Status: Finalized Procedure:            Colonoscopy Indications:          Personal history of colonic polyps Providers:            Christena Deem, MD Referring MD:         Danella Penton, MD (Referring MD) Medicines:            Monitored Anesthesia Care Complications:        No immediate complications. Procedure:            Pre-Anesthesia Assessment:                       - ASA Grade Assessment: III - A patient with severe                        systemic disease.                       After obtaining informed consent, the colonoscope was                        passed under direct vision. Throughout the procedure,                        the patient's blood pressure, pulse, and oxygen                        saturations were monitored continuously. The                        Colonoscope was introduced through the anus and                        advanced to the the cecum, identified by appendiceal                        orifice and ileocecal valve. The patient tolerated the                        procedure well. The quality of the bowel preparation                        was good. Findings:      A few small-mouthed diverticula were found in the sigmoid colon.      The retroflexed view of the distal rectum and anal verge was normal and       showed no anal or rectal abnormalities. Rectal vasculature is prominant.      The digital rectal exam was normal. Pertinent negatives include       posterior midline ;irregularity c/w healed fissure. Impression:           - Diverticulosis in the sigmoid colon.                       - No specimens collected. Recommendation:       - Discharge patient to home.                       -  Repeat colonoscopy  in 5 years for surveillance. Procedure Code(s):    --- Professional ---                       [id number], Colonoscopy, flexible; diagnostic, including                        collection of specimen(s) by brushing or washing, when                        performed (separate procedure) Diagnosis Code(s):    --- Professional ---                       Z86.010, Personal history of colonic polyps                       K57.30, Diverticulosis of large intestine without                        perforation or abscess without bleeding CPT copyright 2016 American Medical Association. All rights reserved. The codes documented in this report are preliminary and upon coder review may  be revised to meet current compliance requirements. Christena DeemMartin U Skulskie, MD 09/19/2015 12:17:35 PM This report has been signed electronically. Number of Addenda: 0 Note Initiated On: 09/19/2015 11:51 AM Scope Withdrawal Time: 0 hours 6 minutes 51 seconds  Total Procedure Duration: 0 hours 15 minutes 5 seconds       [facility]

## 2015-09-20 ENCOUNTER — Encounter: Payer: Self-pay | Admitting: Gastroenterology

## 2015-09-21 NOTE — Anesthesia Postprocedure Evaluation (Signed)
Anesthesia Post Note  Patient: Greg Lowe  Procedure(s) Performed: Procedure(s) (LRB): COLONOSCOPY WITH PROPOFOL (N/A)  Patient location during evaluation: Endoscopy Anesthesia Type: General Level of consciousness: awake and alert Pain management: pain level controlled Vital Signs Assessment: post-procedure vital signs reviewed and stable Respiratory status: spontaneous breathing, nonlabored ventilation, respiratory function stable and patient connected to nasal cannula oxygen Cardiovascular status: blood pressure returned to baseline and stable Postop Assessment: no signs of nausea or vomiting Anesthetic complications: no    Last Vitals:  Filed Vitals:   09/19/15 1240 09/19/15 1250  BP: 140/83 153/91  Pulse: 56 55  Temp:    Resp: 18 20    Last Pain:  Filed Vitals:   09/20/15 0749  PainSc: 0-No pain                 Lenard SimmerAndrew Sairah Knobloch

## 2016-02-28 ENCOUNTER — Emergency Department
Admission: EM | Admit: 2016-02-28 | Discharge: 2016-02-28 | Disposition: A | Discharge status: Home / Self Care | Payer: Worker's Compensation | Attending: Student | Admitting: Student

## 2016-02-28 ENCOUNTER — Emergency Department: Payer: BLUE CROSS/BLUE SHIELD

## 2016-02-28 DIAGNOSIS — Y93F2 Activity, caregiving, lifting: Secondary | ICD-10-CM | POA: Insufficient documentation

## 2016-02-28 DIAGNOSIS — Z951 Presence of aortocoronary bypass graft: Secondary | ICD-10-CM | POA: Insufficient documentation

## 2016-02-28 DIAGNOSIS — X500XXA Overexertion from strenuous movement or load, initial encounter: Secondary | ICD-10-CM | POA: Insufficient documentation

## 2016-02-28 DIAGNOSIS — Y929 Unspecified place or not applicable: Secondary | ICD-10-CM | POA: Insufficient documentation

## 2016-02-28 DIAGNOSIS — Y990 Civilian activity done for income or pay: Secondary | ICD-10-CM | POA: Insufficient documentation

## 2016-02-28 DIAGNOSIS — I10 Essential (primary) hypertension: Secondary | ICD-10-CM | POA: Insufficient documentation

## 2016-02-28 DIAGNOSIS — S46912A Strain of unspecified muscle, fascia and tendon at shoulder and upper arm level, left arm, initial encounter: Secondary | ICD-10-CM | POA: Insufficient documentation

## 2016-02-28 DIAGNOSIS — Z79899 Other long term (current) drug therapy: Secondary | ICD-10-CM | POA: Insufficient documentation

## 2016-02-28 DIAGNOSIS — I2510 Atherosclerotic heart disease of native coronary artery without angina pectoris: Secondary | ICD-10-CM | POA: Insufficient documentation

## 2016-02-28 DIAGNOSIS — Z7982 Long term (current) use of aspirin: Secondary | ICD-10-CM | POA: Insufficient documentation

## 2016-02-28 MED ORDER — TRAMADOL HCL 50 MG PO TABS: 50.0000 mg | ORAL_TABLET | Freq: Four times a day (QID) | ORAL | 0 refills | Status: DC | PRN

## 2016-02-28 NOTE — Discharge Instructions (Signed)
Follow-up doctor if any continued problems. Take tramadol as directed.

## 2016-02-28 NOTE — ED Triage Notes (Addendum)
Patient ambulatory to triage with steady gait, without difficulty or distress noted; pt reports left shoulder pain since yesterday after lifting bleachers; st pain increases with ROM

## 2016-02-28 NOTE — ED Notes (Signed)
States he was moving some bleachers at work yesterday  Developed some soreness to left shoulder   Pain with movement  No deformity

## 2016-02-28 NOTE — ED Provider Notes (Signed)
[facility] Emergency Department Provider Note  ____________________________________________   First MD Initiated Contact with Patient 02/28/16 [phone]     (approximate)  I have reviewed the triage vital signs and the nursing notes.   HISTORY  Chief Complaint Shoulder Pain   HPI Greg Lowe is a 55 y.o. male see a complaint of left shoulder pain. Patient states he was lifting bleachers yesterday and initially did not have any pain however several hours later he began having pain. He denies any known injury other than lifting. Patient is not taking any over-the-counter medications at home last evening or this morning for his pain. He denies any previous injury to shoulder. Patient states that his pain is increased with positioning his arm above his head.He was told by his primary care doctor not to take anti-inflammatories for kidney problems. Currently he rates his pain as an 8 out of 10.   Past Medical History:  Diagnosis Date  . Cervicalgia   . Chicken pox   . Coronary artery disease   . High cholesterol   . Hypertension     There are no active problems to display for this patient.   Past Surgical History:  Procedure Laterality Date  . BYPASS GRAFT     triple  . COLONOSCOPY WITH PROPOFOL N/A 09/19/2015   Procedure: COLONOSCOPY WITH PROPOFOL;  Surgeon: Christena DeemMartin U Skulskie, MD;  [facility];  Service: Endoscopy;  Laterality: N/A;  . CORONARY ARTERY BYPASS GRAFT    . right forearm surgery      Prior to Admission medications   Medication Sig Start Date End Date Taking? Authorizing Provider  carvedilol (COREG) 25 MG tablet Take 25 mg by mouth 2 (two) times daily with a meal.   Yes Historical Provider, MD  aspirin 325 MG tablet Take 325 mg by mouth daily.    Historical Provider, MD  colchicine 0.6 MG tablet Take 0.6 mg by mouth daily.    Historical Provider, MD  losartan-hydrochlorothiazide (HYZAAR) 100-25 MG tablet Take 1 tablet by  mouth daily.    Historical Provider, MD  Multiple Vitamin (MULTIVITAMIN) capsule Take 1 capsule by mouth daily.    Historical Provider, MD  rosuvastatin (CRESTOR) 10 MG tablet Take 10 mg by mouth daily.    Historical Provider, MD  traMADol (ULTRAM) 50 MG tablet Take 1 tablet (50 mg total) by mouth every 6 (six) hours as needed. 02/28/16   Tommi Rumpshonda L Summers, PA-C    Allergies Review of patient's allergies indicates no known allergies.  No family history on file.  Social History Social History  Substance Use Topics  . Smoking status: Never Smoker  . Smokeless tobacco: Never Used  . Alcohol use No    Review of Systems Constitutional: No fever/chills ENT: No sore throat. Cardiovascular: Denies chest pain. Respiratory: Denies shortness of breath. Gastrointestinal: No abdominal pain.  No nausea, no vomiting.   Musculoskeletal: Negative for back pain.Positive for left shoulder pain. Skin: Negative for rash. Neurological: Negative for headaches, focal weakness or numbness.  10-point ROS otherwise negative.  ____________________________________________   PHYSICAL EXAM:  VITAL SIGNS: ED Triage Vitals  Enc Vitals Group     BP 02/28/16 0658 (!) 158/93     Pulse Rate 02/28/16 0658 (!) 55     Resp 02/28/16 0658 18     Temp 02/28/16 0658 98 F (36.7 C)     Temp Source 02/28/16 0658 Oral     SpO2 02/28/16 0658 98 %  Weight 02/28/16 0658 (!) 340 lb (154.2 kg)     Height 02/28/16 0658 6\' 2"  (1.88 m)     Head Circumference --      Peak Flow --      Pain Score 02/28/16 0656 8     Pain Loc --      Pain Edu? --      Excl. in GC? --     Constitutional: Alert and oriented. Well appearing and in no acute distress. Eyes: Conjunctivae are normal. PERRL. EOMI. Head: Atraumatic. Nose: No congestion/rhinnorhea. Neck: No stridor.  No cervical tenderness on palpation posteriorly. Cardiovascular: Normal rate, regular rhythm. Grossly normal heart sounds.  Good peripheral  circulation. Respiratory: Normal respiratory effort.  No retractions. Lungs CTAB. Gastrointestinal: Soft and nontender. No distention.  Musculoskeletal: Examination of the left shoulder there is no gross deformity. There is some tenderness on palpation of the acromioclavicular joint and generalized muscles in the area. No crepitus was noted. Patient had no restrictions with range of motion. Neurologic:  Normal speech and language. No gross focal neurologic deficits are appreciated. No gait instability. Skin:  Skin is warm, dry and intact. No rash noted. No erythema, abrasions or ecchymosis was noted. Psychiatric: Mood and affect are normal. Speech and behavior are normal.  ____________________________________________   LABS (all labs ordered are listed, but only abnormal results are displayed)  Labs Reviewed - No data to display  RADIOLOGY  X-ray of the left shoulder was negative for fracture or dislocation per radiologist. Beaulah CorinI, Rhonda L Summers, personally viewed and evaluated these images (plain radiographs) as part of my medical decision making, as well as reviewing the written report by the radiologist. ____________________________________________   PROCEDURES  Procedure(s) performed: None  Procedures  Critical Care performed: No  ____________________________________________   INITIAL IMPRESSION / ASSESSMENT AND PLAN / ED COURSE  Pertinent labs & imaging results that were available during my care of the patient were reviewed by me and considered in my medical decision making (see chart for details).    Clinical Course  Patient was reassured that he did not have any bone injury to his shoulder. Patient was told by his PCP to avoid NSAIDs due to kidney issues. Patient was given a prescription for tramadol 50 mg 1 every 6 hours as needed for pain. He is also encouraged to follow-up with his primary care doctor if any continued problems with his  shoulder.   ____________________________________________   FINAL CLINICAL IMPRESSION(S) / ED DIAGNOSES  Final diagnoses:  Left shoulder strain, initial encounter      NEW MEDICATIONS STARTED DURING THIS VISIT:  Discharge Medication List as of 02/28/2016  8:14 AM    START taking these medications   Details  traMADol (ULTRAM) 50 MG tablet Take 1 tablet (50 mg total) by mouth every 6 (six) hours as needed., Starting Wed 02/28/2016, Print         Note:  This document was prepared using Dragon voice recognition software and may include unintentional dictation errors.    Tommi Rumpshonda L Summers, PA-C 02/28/16 1124    Gayla DossEryka A Gayle, MD 02/28/16 [phone]

## 2016-12-21 ENCOUNTER — Encounter: Payer: Self-pay | Admitting: Emergency Medicine

## 2016-12-21 ENCOUNTER — Emergency Department: Payer: No Typology Code available for payment source

## 2016-12-21 ENCOUNTER — Emergency Department
Admission: EM | Admit: 2016-12-21 | Discharge: 2016-12-21 | Disposition: A | Discharge status: Home / Self Care | Payer: No Typology Code available for payment source | Attending: Emergency Medicine | Admitting: Emergency Medicine

## 2016-12-21 DIAGNOSIS — I10 Essential (primary) hypertension: Secondary | ICD-10-CM | POA: Insufficient documentation

## 2016-12-21 DIAGNOSIS — M25561 Pain in right knee: Secondary | ICD-10-CM | POA: Diagnosis present

## 2016-12-21 DIAGNOSIS — I2510 Atherosclerotic heart disease of native coronary artery without angina pectoris: Secondary | ICD-10-CM | POA: Diagnosis not present

## 2016-12-21 DIAGNOSIS — Z79899 Other long term (current) drug therapy: Secondary | ICD-10-CM | POA: Insufficient documentation

## 2016-12-21 DIAGNOSIS — M109 Gout, unspecified: Secondary | ICD-10-CM | POA: Diagnosis not present

## 2016-12-21 DIAGNOSIS — Z7982 Long term (current) use of aspirin: Secondary | ICD-10-CM | POA: Diagnosis not present

## 2016-12-21 DIAGNOSIS — Z951 Presence of aortocoronary bypass graft: Secondary | ICD-10-CM | POA: Insufficient documentation

## 2016-12-21 DIAGNOSIS — M25461 Effusion, right knee: Secondary | ICD-10-CM | POA: Diagnosis not present

## 2016-12-21 LAB — SYNOVIAL CELL COUNT + DIFF, W/ CRYSTALS
LYMPHOCYTES-SYNOVIAL FLD: 3 %
Monocyte-Macrophage-Synovial Fluid: 4 %
Neutrophil, Synovial: 93 %
WBC, Synovial: 28959 /mm3 — ABNORMAL HIGH (ref 0–200)

## 2016-12-21 MED ORDER — LIDOCAINE HCL (PF) 1 % IJ SOLN
INTRAMUSCULAR | Status: AC
  Filled 2016-12-21: qty 5

## 2016-12-21 MED ORDER — LIDOCAINE HCL (PF) 1 % IJ SOLN
2.0000 mL | Freq: Once | INTRAMUSCULAR | Status: AC
  Administered 2016-12-21: 2 mL

## 2016-12-21 MED ORDER — LIDOCAINE HCL (PF) 1 % IJ SOLN
2.0000 mL | Freq: Once | INTRAMUSCULAR | Status: AC
  Administered 2016-12-21: 2 mL
  Filled 2016-12-21: qty 5

## 2016-12-21 MED ORDER — TRIAMCINOLONE ACETONIDE 40 MG/ML IJ SUSP
40.0000 mg | Freq: Once | INTRAMUSCULAR | Status: AC
  Administered 2016-12-21: 40 mg via INTRA_ARTICULAR
  Filled 2016-12-21: qty 1

## 2016-12-21 MED ORDER — BUPIVACAINE HCL (PF) 0.5 % IJ SOLN
INTRAMUSCULAR | Status: AC
  Filled 2016-12-21: qty 30

## 2016-12-21 MED ORDER — OXYCODONE-ACETAMINOPHEN 5-325 MG PO TABS
1.0000 | ORAL_TABLET | Freq: Once | ORAL | Status: AC
Start: 2016-12-21 — End: 2016-12-21
  Administered 2016-12-21: 1 via ORAL
  Filled 2016-12-21: qty 1

## 2016-12-21 MED ORDER — BUPIVACAINE HCL 0.5 % IJ SOLN
5.0000 mL | Freq: Once | INTRAMUSCULAR | Status: AC
  Administered 2016-12-21: 5 mL
  Filled 2016-12-21: qty 5

## 2016-12-21 NOTE — ED Triage Notes (Signed)
R knee pain x 5 days, began while lifting weights.

## 2016-12-21 NOTE — Discharge Instructions (Signed)
Please rest ice and elevate the right knee. Use crutches as needed for ambulation. Follow-up with orthopedics if no improvement in 3-4 days. Return to the ER for any worsening symptoms urgent changes in her health.

## 2016-12-21 NOTE — ED Provider Notes (Signed)
[facility]-EMERGENCY DEPARTMENT Provider Note   CSN: [id number] Arrival date & time: 12/21/16  0840     History   Chief Complaint Chief Complaint  Patient presents with  . Knee Pain    HPI Greg Lowe is a 56 y.o. male presents to the emergency department for evaluation of severe right knee pain and swelling. Patient states 6 days ago he performed a vigorous workout with a lot of squatting exercises. He had some mild pain and swelling. Was seen by PCP to attempted aspiration with inability to remove fluid from the joint. Patient states today the pain became very severe, 10 out of 10 he developed a lot of tightness and swelling throughout the right knee. He has a history of gout. He's been taking ibuprofen with no relief. Patient took indomethacin this morning. He denies any fevers. No chest pain, shortness of breathP, Headache, vision changes. tient's pain is located throughout the suprapatellar region, he describes this area is very tight, he is unable to bend his knee.  HPI  Past Medical History:  Diagnosis Date  . Cervicalgia   . Chicken pox   . Coronary artery disease   . High cholesterol   . Hypertension     There are no active problems to display for this patient.   Past Surgical History:  Procedure Laterality Date  . BYPASS GRAFT     triple  . COLONOSCOPY WITH PROPOFOL N/A 09/19/2015   Procedure: COLONOSCOPY WITH PROPOFOL;  Surgeon: Christena Deem, MD;  Location: [facility] ENDOSCOPY;  Service: Endoscopy;  Laterality: N/A;  . CORONARY ARTERY BYPASS GRAFT    . right forearm surgery         Home Medications    Prior to Admission medications   Medication Sig Start Date End Date Taking? Authorizing Provider  aspirin 325 MG tablet Take 325 mg by mouth daily.    [provider]  carvedilol (COREG) 25 MG tablet Take 25 mg by mouth 2 (two) times daily with a meal.    [provider]  colchicine 0.6 MG tablet Take 0.6 mg by mouth daily.    [provider]  losartan-hydrochlorothiazide (HYZAAR) 100-25 MG tablet Take 1 tablet by mouth daily.    [provider]  Multiple Vitamin (MULTIVITAMIN) capsule Take 1 capsule by mouth daily.    [provider]  rosuvastatin (CRESTOR) 10 MG tablet Take 10 mg by mouth daily.    [provider]  traMADol (ULTRAM) 50 MG tablet Take 1 tablet (50 mg total) by mouth every 6 (six) hours as needed. 02/28/16   Tommi Rumps, PA-C    Family History No family history on file.  Social History Social History  Substance Use Topics  . Smoking status: Never Smoker  . Smokeless tobacco: Never Used  . Alcohol use No     Allergies   Patient has no known allergies.   Review of Systems Review of Systems  Constitutional: Negative.  Negative for activity change, appetite change, chills and fever.  HENT: Negative for congestion, ear pain, mouth sores, rhinorrhea, sinus pressure, sore throat and trouble swallowing.   Eyes: Negative for photophobia, pain and discharge.  Respiratory: Negative for cough, chest tightness and shortness of breath.   Cardiovascular: Negative for chest pain and leg swelling.  Gastrointestinal: Negative for abdominal distention, abdominal pain, diarrhea, nausea and vomiting.  Genitourinary: Negative for difficulty urinating and dysuria.  Musculoskeletal: Positive for arthralgias and joint swelling. Negative for back pain and gait problem.  Skin: Negative for color change and rash.  Neurological: Negative for dizziness and headaches.  Hematological: Negative for adenopathy.  Psychiatric/Behavioral: Negative for agitation and behavioral problems.     Physical Exam Updated Vital Signs BP (!) 185/100 (BP Location: Right Arm)   Pulse 70   Temp 98.5 F (36.9 C) (Oral)   Resp 18   Ht 6\' 2"  (1.88 m)   Wt (!) 154.2 kg (340 lb)   SpO2 99%   BMI 43.65 kg/m   Physical Exam  Constitutional: He is oriented to person, place, and time. He appears  well-developed and well-nourished.  HENT:  Head: Normocephalic and atraumatic.  Eyes: Conjunctivae are normal.  Neck: Normal range of motion.  Cardiovascular: Normal rate.   Pulmonary/Chest: Effort normal. No respiratory distress.  Musculoskeletal:  Examination of the right knee shows patient is able to straight leg raise. He has a negative logroll test. He has good ankle plantarflexion dorsiflexion right lower straight. There is no edema or swelling throughout the lower leg. He has a negative Homans sign. He has moderate to severe knee effusion of the right knee with inability to flex the knee past 15 of flexion. There is no warmth or redness. Knee is tender to palpation along the suprapatellar region. Stable to valgus and varus stress testing  Neurological: He is alert and oriented to person, place, and time.  Skin: Skin is warm and dry. No rash noted. No erythema.  Psychiatric: He has a normal mood and affect. His behavior is normal. Judgment and thought content normal.     ED Treatments / Results  Labs (all labs ordered are listed, but only abnormal results are displayed) Labs Reviewed  SYNOVIAL CELL COUNT + DIFF, W/ CRYSTALS - Abnormal; Notable for the following:       Result Value   Appearance-Synovial TURBID (*)    WBC, Synovial 28,959 (*)    All other components within normal limits  BODY FLUID CULTURE    EKG  EKG Interpretation None       Radiology Dg Knee Complete 4 Views Right  Result Date: 12/21/2016 CLINICAL DATA:  Right knee pain and swelling for 4 days. No known injury. EXAM: RIGHT KNEE - COMPLETE 4+ VIEW COMPARISON:  MRI right knee 08/23/2015. Plain films right knee 05/01/2014. FINDINGS: No acute bony or joint abnormality is identified. Moderate to moderately large joint effusion is seen. Spurring about the patella is noted. Joint spaces are preserved. IMPRESSION: Moderate to moderately large joint effusion. Negative for fracture. Unchanged spurring about the  patella at the quadriceps and patellar tendon attachments. Electronically Signed   By: Drusilla Kannerhomas  Dalessio M.D.   On: 12/21/2016 09:47    Procedures Procedures (including critical care time) Right knee aspiration: The patient agreed and said to her right knee aspiration. Skin was prepped with chlorhexidine and alcohol, he was then anesthetized with 3 cc of 1% lidocaine. He is and then injected with a 18-gauge needle and 60 cc syringe, 85 cc of cloudy synovial fluid was aspirated from the right knee. Patient tolerated procedure well. Patient saw improvement with pain. Ace wrap was applied.   Right knee cortisone injection: After cell count and cultures returned indicating gout, no joint infection. Patient agreed and consented to a right knee intra-articular cortisone injection. Skin was cleansed with chlorhexidine alcohol, patient was injected with a bolus solution into the right knee  joint of 3 cc of 1% lidocaine, 3 cc of 0.5% bupivacaine, 1 cc of Kenalog 40 mg. Patient tolerated the  procedure well. Ace wrap was reapplied.  Medications Ordered in ED Medications  lidocaine (PF) (XYLOCAINE) 1 % injection 2 mL (not administered)  triamcinolone acetonide (KENALOG-40) injection 40 mg (not administered)  lidocaine (PF) (XYLOCAINE) 1 % injection 2 mL (not administered)  bupivacaine (MARCAINE) 0.5 % (with pres) injection 5 mL (not administered)  lidocaine (PF) (XYLOCAINE) 1 % injection (not administered)  bupivacaine (MARCAINE) 0.5 % injection (not administered)  oxyCODONE-acetaminophen (PERCOCET/ROXICET) 5-325 MG per tablet 1 tablet (1 tablet Oral Given 12/21/16 0924)     Initial Impression / Assessment and Plan / ED Course  I have reviewed the triage vital signs and the nursing notes.  Pertinent labs & imaging results that were available during my care of the patient were reviewed by me and considered in my medical decision making (see chart for details).    56 year old male with right knee  effusion, gout flare up to the right knee. He tolerated aspiration and injection of cortisone well. There is no sign of septic joint. Fluid analysis indicated crystals throughout the right knee consistent with gout patient will follow-up with PCP if no improvement in 3-4 days. He is educated on signs and symptoms return to the ED for.  Final Clinical Impressions(s) / ED Diagnoses   Final diagnoses:  Acute gout of right knee, unspecified cause  Knee effusion, right    New Prescriptions New Prescriptions   No medications on file     Ronnette Juniper 12/21/16 1305    Myrna Blazer, MD 12/21/16 [phone]

## 2016-12-24 LAB — BODY FLUID CULTURE: CULTURE: NO GROWTH

## 2017-02-22 ENCOUNTER — Emergency Department: Payer: No Typology Code available for payment source

## 2017-02-22 ENCOUNTER — Encounter: Payer: Self-pay | Admitting: Emergency Medicine

## 2017-02-22 ENCOUNTER — Emergency Department
Admission: EM | Admit: 2017-02-22 | Discharge: 2017-02-22 | Disposition: A | Discharge status: Home / Self Care | Payer: No Typology Code available for payment source | Attending: Emergency Medicine | Admitting: Emergency Medicine

## 2017-02-22 DIAGNOSIS — I10 Essential (primary) hypertension: Secondary | ICD-10-CM | POA: Insufficient documentation

## 2017-02-22 DIAGNOSIS — M25572 Pain in left ankle and joints of left foot: Secondary | ICD-10-CM | POA: Diagnosis present

## 2017-02-22 DIAGNOSIS — Z79899 Other long term (current) drug therapy: Secondary | ICD-10-CM | POA: Diagnosis not present

## 2017-02-22 DIAGNOSIS — I25810 Atherosclerosis of coronary artery bypass graft(s) without angina pectoris: Secondary | ICD-10-CM | POA: Diagnosis not present

## 2017-02-22 MED ORDER — PREDNISONE 50 MG PO TABS: ORAL_TABLET | ORAL | 0 refills | Status: DC

## 2017-02-22 MED ORDER — INDOMETHACIN 50 MG PO CAPS
50.0000 mg | ORAL_CAPSULE | Freq: Once | ORAL | Status: AC
  Administered 2017-02-22: 50 mg via ORAL
  Filled 2017-02-22: qty 1

## 2017-02-22 MED ORDER — INDOMETHACIN 50 MG PO CAPS: 50.0000 mg | ORAL_CAPSULE | Freq: Two times a day (BID) | ORAL | 0 refills | Status: AC

## 2017-02-22 NOTE — ED Notes (Signed)
Called pharmacy to send Indomethacin to [facility]

## 2017-02-22 NOTE — ED Provider Notes (Signed)
[facility] Emergency Department Provider Note  ____________________________________________  Time seen: Approximately 9:22 PM  I have reviewed the triage vital signs and the nursing notes.   HISTORY  Chief Complaint Ankle Pain    HPI Greg Lowe is a 56 y.o. male presenting to the emergency department with left ankle pain for the past 2 days. Patient states that he was working outside 2 days ago and noticed worsening ankle pain. He currently rates his left ankle pain at 10 out of 10 in intensity. He has noticed left ankle swelling. Patient has a history of gout and states that his current symptoms feel similar to prior gout flares in the past although patient typically has gout in his right knee and right hand. Patient denies falls or mechanisms of trauma. He denies weakness, radiculopathy or changes in sensation of the left lower extremity. Patient denies a history of septic joints. States that he sustained a left ankle fracture "several years ago".   Past Medical History:  Diagnosis Date  . Cervicalgia   . Chicken pox   . Coronary artery disease   . High cholesterol   . Hypertension     There are no active problems to display for this patient.   Past Surgical History:  Procedure Laterality Date  . BYPASS GRAFT     triple  . COLONOSCOPY WITH PROPOFOL N/A 09/19/2015   Procedure: COLONOSCOPY WITH PROPOFOL;  Surgeon: Christena DeemMartin U Skulskie, MD;  [facility];  Service: Endoscopy;  Laterality: N/A;  . CORONARY ARTERY BYPASS GRAFT    . right forearm surgery      Prior to Admission medications   Medication Sig Start Date End Date Taking? Authorizing Provider  allopurinol (ZYLOPRIM) 100 MG tablet Take 300 mg by mouth daily.   Yes [provider]  aspirin 325 MG tablet Take 325 mg by mouth daily.   Yes [provider]  carvedilol (COREG) 25 MG tablet Take 50 mg by mouth 2 (two) times daily with a meal.    Yes [provider]  Multiple Vitamin (MULTIVITAMIN) capsule Take 1 capsule by mouth daily.   Yes [provider]  rosuvastatin (CRESTOR) 10 MG tablet Take 10 mg by mouth daily.   Yes [provider]  saw palmetto 160 MG capsule Take 160 mg by mouth 2 (two) times daily.   Yes [provider]  indomethacin (INDOCIN) 50 MG capsule Take 1 capsule (50 mg total) by mouth 2 (two) times daily with a meal. 02/22/17 03/01/17  Orvil FeilWoods, Dante Cooter M, PA-C  predniSONE (DELTASONE) 50 MG tablet Take 1 tablet by mouth for the next 5 days. 02/22/17   Orvil FeilWoods, Johnnie Goynes M, PA-C    Allergies Patient has no known allergies.  History reviewed. No pertinent family history.  Social History Social History  Substance Use Topics  . Smoking status: Never Smoker  . Smokeless tobacco: Never Used  . Alcohol use No     Review of Systems  Constitutional: No fever/chills Eyes: No visual changes. No discharge ENT: No upper respiratory complaints. Cardiovascular: no chest pain. Respiratory: no cough. No SOB. Musculoskeletal: She has left ankle pain. Skin: Negative for rash, abrasions, lacerations, ecchymosis. Neurological: Negative for headaches, focal weakness or numbness.   ____________________________________________   PHYSICAL EXAM:  VITAL SIGNS: ED Triage Vitals  Enc Vitals Group     BP 02/22/17 2007 (!) 174/97     Pulse Rate 02/22/17 2007 71     Resp 02/22/17 2007 19  Temp 02/22/17 2007 98.4 F (36.9 C)     Temp Source 02/22/17 2007 Oral     SpO2 02/22/17 2007 98 %     Weight 02/22/17 2005 (!) 340 lb (154.2 kg)     Height 02/22/17 2005 6' 2.5" (1.892 m)     Head Circumference --      Peak Flow --      Pain Score 02/22/17 2005 10     Pain Loc --      Pain Edu? --      Excl. in GC? --      Constitutional: Alert and oriented. Well appearing and in no acute distress. Eyes: Conjunctivae are normal. PERRL. EOMI. Head: Atraumatic. Cardiovascular: Normal rate, regular rhythm.  Normal S1 and S2.  Good peripheral circulation. Respiratory: Normal respiratory effort without tachypnea or retractions. Lungs CTAB. Good air entry to the bases with no decreased or absent breath sounds. Musculoskeletal:Patient has 5 out of 5 strength in the lower extremities bilaterally. Patient is able to move all 5 left toes. Patient is able to perform limited flexion and extension at the left ankle, likely secondary to pain. Palpable dorsalis pedis pulses, left Neurologic:  Normal speech and language. No gross focal neurologic deficits are appreciated.  Skin:  Skin is warm, dry and intact. No rash noted. Psychiatric: Mood and affect are normal. Speech and behavior are normal. Patient exhibits appropriate insight and judgement.   ____________________________________________   LABS (all labs ordered are listed, but only abnormal results are displayed)  Labs Reviewed - No data to display ____________________________________________  EKG   ____________________________________________  RADIOLOGY Geraldo Pitter, personally viewed and evaluated these images (plain radiographs) as part of my medical decision making, as well as reviewing the written report by the radiologist  .Dg Ankle Complete Left  Result Date: 02/22/2017 CLINICAL DATA:  Left ankle pain and swelling. EXAM: LEFT ANKLE COMPLETE - 3+ VIEW COMPARISON:  Foot radiographs from 01/19/2010 FINDINGS: There is no evidence of fracture, dislocation, or joint effusion. Degenerative joint space narrowing and spurring across the dorsum of the midfoot, in particular the navicular-cuneiform articulations as well as joint space narrowing of the tibiotalar joint medially with minimal spurring off the medial malleolus are identified consistent with osteoarthritis. Soft tissue swelling is seen over the dorsum of the midfoot. Plantar calcaneal enthesophyte is noted. Vascular calcifications consistent phleboliths are seen along the medial and  posterior aspect of the distal left leg. IMPRESSION: 1. Tibiotalar and midfoot osteoarthritis. 2. Small plantar calcaneal enthesophyte. 3. Mild soft tissue swelling of the dorsum of the midfoot without underlying fracture or bone destruction. No joint dislocations. Electronically Signed   By: Tollie Eth M.D.   On: 02/22/2017 20:52    ____________________________________________    PROCEDURES  Procedure(s) performed:    Procedures    Medications  indomethacin (INDOCIN) capsule 50 mg (not administered)     ____________________________________________   INITIAL IMPRESSION / ASSESSMENT AND PLAN / ED COURSE  Pertinent labs & imaging results that were available during my care of the patient were reviewed by me and considered in my medical decision making (see chart for details).  Review of the [place] CSRS was performed in accordance of the [facility] prior to dispensing any controlled drugs.     Assessment and plan Left ankle pain Patient presents to the emergency Department with left ankle pain that has progressively worsened over the past 2 days. DG left ankle reveals arthritic changes. Differential diagnosis includes gout versus arthritis. Patient was  given Indocin in the emergency department. He was discharged with Indocin. Patient was also discharged with a prescription for prednisone. Patient was advised to not take Indocin and prednisone together and patient voiced understanding regarding this recommendation. Patient was advised to start prednisone if Indocin does not provide him symptomatic relief. A referral was made to podiatry, Dr. Orland Jarred all patient questions were answered.     ____________________________________________  FINAL CLINICAL IMPRESSION(S) / ED DIAGNOSES  Final diagnoses:  Acute left ankle pain      NEW MEDICATIONS STARTED DURING THIS VISIT:  New Prescriptions   INDOMETHACIN (INDOCIN) 50 MG CAPSULE    Take 1 capsule (50 mg total) by mouth 2 (two) times  daily with a meal.   PREDNISONE (DELTASONE) 50 MG TABLET    Take 1 tablet by mouth for the next 5 days.        This chart was dictated using voice recognition software/Dragon. Despite best efforts to proofread, errors can occur which can change the meaning. Any change was purely unintentional.    Orvil Feil, PA-C 02/22/17 2128    Sharyn Creamer, MD 02/23/17 [phone]

## 2017-02-22 NOTE — ED Notes (Signed)
Patient wheeled to car by this RN.

## 2017-02-22 NOTE — ED Triage Notes (Signed)
Pt says he was working outside Thursday when started having left ankle pain; Friday swelling increased; denies actual injury that he recalls; pt increases with ambulation; pt using cane to assist the last few days

## 2018-05-30 ENCOUNTER — Other Ambulatory Visit: Payer: Self-pay

## 2018-05-30 ENCOUNTER — Emergency Department: Payer: PRIVATE HEALTH INSURANCE

## 2018-05-30 ENCOUNTER — Emergency Department
Admission: EM | Admit: 2018-05-30 | Discharge: 2018-05-30 | Disposition: A | Discharge status: Home / Self Care | Payer: PRIVATE HEALTH INSURANCE | Attending: Emergency Medicine | Admitting: Emergency Medicine

## 2018-05-30 ENCOUNTER — Encounter: Payer: Self-pay | Admitting: Emergency Medicine

## 2018-05-30 DIAGNOSIS — S83401A Sprain of unspecified collateral ligament of right knee, initial encounter: Secondary | ICD-10-CM | POA: Diagnosis not present

## 2018-05-30 DIAGNOSIS — I10 Essential (primary) hypertension: Secondary | ICD-10-CM | POA: Diagnosis not present

## 2018-05-30 DIAGNOSIS — Y929 Unspecified place or not applicable: Secondary | ICD-10-CM | POA: Insufficient documentation

## 2018-05-30 DIAGNOSIS — Y999 Unspecified external cause status: Secondary | ICD-10-CM | POA: Insufficient documentation

## 2018-05-30 DIAGNOSIS — Z79899 Other long term (current) drug therapy: Secondary | ICD-10-CM | POA: Insufficient documentation

## 2018-05-30 DIAGNOSIS — I2510 Atherosclerotic heart disease of native coronary artery without angina pectoris: Secondary | ICD-10-CM | POA: Diagnosis not present

## 2018-05-30 DIAGNOSIS — Z951 Presence of aortocoronary bypass graft: Secondary | ICD-10-CM | POA: Insufficient documentation

## 2018-05-30 DIAGNOSIS — M25461 Effusion, right knee: Secondary | ICD-10-CM | POA: Insufficient documentation

## 2018-05-30 DIAGNOSIS — W502XXA Accidental twist by another person, initial encounter: Secondary | ICD-10-CM | POA: Diagnosis not present

## 2018-05-30 DIAGNOSIS — Y939 Activity, unspecified: Secondary | ICD-10-CM | POA: Insufficient documentation

## 2018-05-30 DIAGNOSIS — S8991XA Unspecified injury of right lower leg, initial encounter: Secondary | ICD-10-CM | POA: Diagnosis present

## 2018-05-30 MED ORDER — TRAMADOL HCL 50 MG PO TABS: 50.0000 mg | ORAL_TABLET | Freq: Four times a day (QID) | ORAL | 0 refills | Status: AC | PRN

## 2018-05-30 MED ORDER — IBUPROFEN 600 MG PO TABS: 600.0000 mg | ORAL_TABLET | Freq: Three times a day (TID) | ORAL | 0 refills | Status: DC | PRN

## 2018-05-30 NOTE — Discharge Instructions (Signed)
Follow discharge care instruction take medication as directed.  Follow-up with orthopedic clinic if no improvement in 5 days.

## 2018-05-30 NOTE — ED Provider Notes (Signed)
[facility] Emergency Department Provider Note   ____________________________________________   First MD Initiated Contact with Patient 05/30/18 1226     (approximate)  I have reviewed the triage vital signs and the nursing notes.   HISTORY  Chief Complaint Knee Injury    HPI Greg Lowe is a 57 y.o. male patient complain of right knee pain secondary to a twisting incident 3 days ago.  Patient states since the twisting incident has continued pain which increased with weightbearing.  Patient rates pain as 8/10.  Patient described pain is "ache".  No palliative measure for complaint.  Past Medical History:  Diagnosis Date  . Cervicalgia   . Chicken pox   . Coronary artery disease   . High cholesterol   . Hypertension     There are no active problems to display for this patient.   Past Surgical History:  Procedure Laterality Date  . BYPASS GRAFT     triple  . COLONOSCOPY WITH PROPOFOL N/A 09/19/2015   Procedure: COLONOSCOPY WITH PROPOFOL;  Surgeon: Christena Deem, MD;  Location: [facility] ENDOSCOPY;  Service: Endoscopy;  Laterality: N/A;  . CORONARY ARTERY BYPASS GRAFT    . right forearm surgery      Prior to Admission medications   Medication Sig Start Date End Date Taking? Authorizing Provider  allopurinol (ZYLOPRIM) 100 MG tablet Take 300 mg by mouth daily.    [provider]  aspirin 325 MG tablet Take 325 mg by mouth daily.    [provider]  carvedilol (COREG) 25 MG tablet Take 50 mg by mouth 2 (two) times daily with a meal.     [provider]  ibuprofen (ADVIL,MOTRIN) 600 MG tablet Take 1 tablet (600 mg total) by mouth every 8 (eight) hours as needed. 05/30/18   Joni Reining, PA-C  Multiple Vitamin (MULTIVITAMIN) capsule Take 1 capsule by mouth daily.    [provider]  predniSONE (DELTASONE) 50 MG tablet Take 1 tablet by mouth for the next 5 days. 02/22/17   Orvil Feil, PA-C  rosuvastatin  (CRESTOR) 10 MG tablet Take 10 mg by mouth daily.    [provider]  saw palmetto 160 MG capsule Take 160 mg by mouth 2 (two) times daily.    [provider]  traMADol (ULTRAM) 50 MG tablet Take 1 tablet (50 mg total) by mouth every 6 (six) hours as needed. 05/30/18 05/30/19  Joni Reining, PA-C    Allergies Patient has no known allergies.  No family history on file.  Social History Social History   Tobacco Use  . Smoking status: Never Smoker  . Smokeless tobacco: Never Used  Substance Use Topics  . Alcohol use: No  . Drug use: No    Review of Systems Constitutional: No fever/chills Eyes: No visual changes. ENT: No sore throat. Cardiovascular: Denies chest pain. Respiratory: Denies shortness of breath. Gastrointestinal: No abdominal pain.  No nausea, no vomiting.  No diarrhea.  No constipation. Genitourinary: Negative for dysuria. Musculoskeletal: Right knee pain. Skin: Negative for rash. Neurological: Negative for headaches, focal weakness or numbness. Endocrine:Hyperlipidemia and hypertension. ____________________________________________   PHYSICAL EXAM:  VITAL SIGNS: ED Triage Vitals [05/30/18 1220]  Enc Vitals Group     BP (!) 154/87     Pulse Rate (!) 57     Resp 18     Temp 98.1 F (36.7 C)     Temp Source Oral     SpO2 98 %  Weight (!) 340 lb (154.2 kg)     Height 6' 2.5" (1.892 m)     Head Circumference      Peak Flow      Pain Score 8     Pain Loc      Pain Edu?      Excl. in GC?     Constitutional: Alert and oriented. Well appearing and in no acute distress. Cardiovascular: Normal rate, regular rhythm. Grossly normal heart sounds.  Good peripheral circulation. Respiratory: Normal respiratory effort.  No retractions. Lungs CTAB. Musculoskeletal: No lower extremity tenderness nor edema.  No joint effusions. Neurologic:  Normal speech and language. No gross focal neurologic deficits are appreciated. No gait  instability. Skin:  Skin is warm, dry and intact. No rash noted. Psychiatric: Mood and affect are normal. Speech and behavior are normal.  ____________________________________________   LABS (all labs ordered are listed, but only abnormal results are displayed)  Labs Reviewed - No data to display ____________________________________________  EKG   ____________________________________________  RADIOLOGY  ED MD interpretation:    Official radiology report(s): Dg Knee Complete 4 Views Right  Result Date: 05/30/2018 CLINICAL DATA:  PT arrived with complaints of medial right knee pain. Pt states she had mechanical injury that caused pt to turn wrong on Tuesday and has has pain in his right knee since. Swelling. Fluid drained 2458yr ago. EXAM: RIGHT KNEE - COMPLETE 4+ VIEW COMPARISON:  12/21/2016 FINDINGS: No evidence of fracture, dislocation. Probable effusion in suprapatellar bursa. Traction spurs from the patella. No evidence of arthropathy or other focal bone abnormality. Soft tissues are unremarkable. IMPRESSION: 1. Negative for fracture or dislocation. 2. Probable joint effusion. Electronically Signed   By: Corlis Leak  Hassell M.D.   On: 05/30/2018 13:00    ____________________________________________   PROCEDURES  Procedure(s) performed: None  Procedures  Critical Care performed: No  ____________________________________________   INITIAL IMPRESSION / ASSESSMENT AND PLAN / ED COURSE  As part of my medical decision making, I reviewed the following data within the electronic MEDICAL RECORD NUMBER    Right knee pain secondary to sprain.  Discussed x-ray findings with patient.  Patient placed in knee immobilizer given discharge care instructions.  Patient advised take medication as directed and follow-up with orthopedic clinic if no improvement in 5 days.      ____________________________________________   FINAL CLINICAL IMPRESSION(S) / ED DIAGNOSES  Final diagnoses:  Sprain of  collateral ligament of right knee, initial encounter  Effusion of right knee     ED Discharge Orders         Ordered    traMADol (ULTRAM) 50 MG tablet  Every 6 hours PRN     05/30/18 1319    ibuprofen (ADVIL,MOTRIN) 600 MG tablet  Every 8 hours PRN     05/30/18 1319           Note:  This document was prepared using Dragon voice recognition software and may include unintentional dictation errors.    Joni ReiningSmith,  K, PA-C 05/30/18 1322    Dionne BucySiadecki, Sebastian, MD 05/30/18 [phone]

## 2018-05-30 NOTE — ED Notes (Signed)
Pt  Was  Playing basketball   5  Days  And  Twisted / turned  It  Pain getting worse  With time   Pt able to walk  But has pain on weight bearing

## 2018-05-30 NOTE — ED Triage Notes (Signed)
PT arrived with complaints of right knee pain. Pt states she had mechanical injury that caused pt to turn wrong on Tuesday and has has pain in his right knee since. No obvious deformity.

## 2018-06-17 ENCOUNTER — Other Ambulatory Visit: Payer: Self-pay

## 2018-06-17 ENCOUNTER — Emergency Department: Payer: No Typology Code available for payment source

## 2018-06-17 ENCOUNTER — Emergency Department
Admission: EM | Admit: 2018-06-17 | Discharge: 2018-06-17 | Disposition: A | Discharge status: Home / Self Care | Payer: No Typology Code available for payment source | Attending: Student in an Organized Health Care Education/Training Program | Admitting: Student in an Organized Health Care Education/Training Program

## 2018-06-17 DIAGNOSIS — Z8739 Personal history of other diseases of the musculoskeletal system and connective tissue: Secondary | ICD-10-CM | POA: Insufficient documentation

## 2018-06-17 DIAGNOSIS — Y92838 Other recreation area as the place of occurrence of the external cause: Secondary | ICD-10-CM | POA: Diagnosis not present

## 2018-06-17 DIAGNOSIS — Y998 Other external cause status: Secondary | ICD-10-CM | POA: Diagnosis not present

## 2018-06-17 DIAGNOSIS — X509XXA Other and unspecified overexertion or strenuous movements or postures, initial encounter: Secondary | ICD-10-CM | POA: Diagnosis not present

## 2018-06-17 DIAGNOSIS — I2510 Atherosclerotic heart disease of native coronary artery without angina pectoris: Secondary | ICD-10-CM | POA: Diagnosis not present

## 2018-06-17 DIAGNOSIS — Y93B3 Activity, free weights: Secondary | ICD-10-CM | POA: Insufficient documentation

## 2018-06-17 DIAGNOSIS — M25531 Pain in right wrist: Secondary | ICD-10-CM

## 2018-06-17 DIAGNOSIS — Z79899 Other long term (current) drug therapy: Secondary | ICD-10-CM | POA: Insufficient documentation

## 2018-06-17 DIAGNOSIS — S6991XA Unspecified injury of right wrist, hand and finger(s), initial encounter: Secondary | ICD-10-CM

## 2018-06-17 DIAGNOSIS — Z951 Presence of aortocoronary bypass graft: Secondary | ICD-10-CM | POA: Insufficient documentation

## 2018-06-17 DIAGNOSIS — I10 Essential (primary) hypertension: Secondary | ICD-10-CM | POA: Insufficient documentation

## 2018-06-17 LAB — URIC ACID: Uric Acid, Serum: 6.3 mg/dL (ref 3.7–8.6)

## 2018-06-17 MED ORDER — KETOROLAC TROMETHAMINE 30 MG/ML IJ SOLN
30.0000 mg | Freq: Once | INTRAMUSCULAR | Status: AC
  Administered 2018-06-17: 30 mg via INTRAMUSCULAR
  Filled 2018-06-17: qty 1

## 2018-06-17 MED ORDER — METHYLPREDNISOLONE SODIUM SUCC 125 MG IJ SOLR
125.0000 mg | Freq: Once | INTRAMUSCULAR | Status: AC
  Administered 2018-06-17: 125 mg via INTRAMUSCULAR
  Filled 2018-06-17: qty 2

## 2018-06-17 MED ORDER — PREDNISONE 10 MG PO TABS: ORAL_TABLET | ORAL | 0 refills | Status: DC

## 2018-06-17 NOTE — ED Provider Notes (Signed)
[facility] Emergency Department Provider Note  ____________________________________________  Time seen: Approximately 8:19 AM  I have reviewed the triage vital signs and the nursing notes.   HISTORY  Chief Complaint Wrist Pain    HPI Greg Lowe is a 57 y.o. male presents emergency department for evaluation of left wrist pain for 2 days.  Patient states that he went to the gym with his grandson last week on Thursday and Friday and was lifting weights.  He had some minor pain over the weekend but pain primarily started on Monday morning.  He thought that pain was from gout so he called his primary care and started colchicine on Monday.  He states that the colchicine usually improves his symptoms quickly but pain has not improved at all.  He states he may have injured it while lifting weights but no specific trauma.  No additional injuries.  Past Medical History:  Diagnosis Date  . Cervicalgia   . Chicken pox   . Coronary artery disease   . High cholesterol   . Hypertension     There are no active problems to display for this patient.   Past Surgical History:  Procedure Laterality Date  . BYPASS GRAFT     triple  . COLONOSCOPY WITH PROPOFOL N/A 09/19/2015   Procedure: COLONOSCOPY WITH PROPOFOL;  Surgeon: Christena Deem, MD;  Location: [facility] ENDOSCOPY;  Service: Endoscopy;  Laterality: N/A;  . CORONARY ARTERY BYPASS GRAFT    . right forearm surgery      Prior to Admission medications   Medication Sig Start Date End Date Taking? Authorizing Provider  allopurinol (ZYLOPRIM) 100 MG tablet Take 300 mg by mouth daily.    [provider]  aspirin 325 MG tablet Take 325 mg by mouth daily.    [provider]  carvedilol (COREG) 25 MG tablet Take 50 mg by mouth 2 (two) times daily with a meal.     [provider]  ibuprofen (ADVIL,MOTRIN) 600 MG tablet Take 1 tablet (600 mg total) by mouth every 8 (eight) hours as needed.  05/30/18   Joni Reining, PA-C  Multiple Vitamin (MULTIVITAMIN) capsule Take 1 capsule by mouth daily.    [provider]  predniSONE (DELTASONE) 10 MG tablet Take 6 tablets on day 1, take 5 tablets on day 2, take 4 tablets on day 3, take 3 tablets on day 4, take 2 tablets on day 5, take 1 tablet on day 6 06/17/18   Enid Derry, PA-C  rosuvastatin (CRESTOR) 10 MG tablet Take 10 mg by mouth daily.    [provider]  saw palmetto 160 MG capsule Take 160 mg by mouth 2 (two) times daily.    [provider]  traMADol (ULTRAM) 50 MG tablet Take 1 tablet (50 mg total) by mouth every 6 (six) hours as needed. 05/30/18 05/30/19  Joni Reining, PA-C    Allergies Patient has no known allergies.  No family history on file.  Social History Social History   Tobacco Use  . Smoking status: Never Smoker  . Smokeless tobacco: Never Used  Substance Use Topics  . Alcohol use: No  . Drug use: No     Review of Systems  Cardiovascular: No chest pain. Respiratory: No SOB. Gastrointestinal: No abdominal pain.  No nausea, no vomiting.  Musculoskeletal: Positive for wrist pain.  Skin: Negative for rash, abrasions, lacerations, ecchymosis. Neurological: Negative for numbness or tingling   ____________________________________________   PHYSICAL EXAM:  VITAL  SIGNS: ED Triage Vitals  Enc Vitals Group     BP 06/17/18 0802 (!) 156/90     Pulse Rate 06/17/18 0802 62     Resp 06/17/18 0802 16     Temp 06/17/18 0802 98.2 F (36.8 C)     Temp Source 06/17/18 0802 Oral     SpO2 06/17/18 0802 98 %     Weight 06/17/18 0800 (!) 340 lb (154.2 kg)     Height 06/17/18 0800 6\' 2"  (1.88 m)     Head Circumference --      Peak Flow --      Pain Score 06/17/18 0800 10     Pain Loc --      Pain Edu? --      Excl. in GC? --      Constitutional: Alert and oriented. Well appearing and in no acute distress. Eyes: Conjunctivae are normal. PERRL. EOMI. Head: Atraumatic. ENT:       Ears:      Nose: No congestion/rhinnorhea.      Mouth/Throat: Mucous membranes are moist.  Neck: No stridor.   Cardiovascular: Normal rate, regular rhythm.  Good peripheral circulation. Symmetric radial pulses.  Respiratory: Normal respiratory effort without tachypnea or retractions. Lungs CTAB. Good air entry to the bases with no decreased or absent breath sounds. Gastrointestinal: Bowel sounds 4 quadrants. Soft and nontender to palpation. No guarding or rigidity. No palpable masses. No distention.  Musculoskeletal: Full range of motion to all extremities. No gross deformities appreciated.  Moderate swelling to right wrist with limited range of motion due to pain. No overlying erythema. Neurologic:  Normal speech and language. No gross focal neurologic deficits are appreciated.  Skin:  Skin is warm, dry and intact. No rash noted. Psychiatric: Mood and affect are normal. Speech and behavior are normal. Patient exhibits appropriate insight and judgement.   ____________________________________________   LABS (all labs ordered are listed, but only abnormal results are displayed)  Labs Reviewed  URIC ACID   ____________________________________________  EKG   ____________________________________________  RADIOLOGY Lexine BatonI, Laydon Martis, personally viewed and evaluated these images (plain radiographs) as part of my medical decision making, as well as reviewing the written report by the radiologist.  Dg Wrist Complete Right  Result Date: 06/17/2018 CLINICAL DATA:  RIGHT wrist pain that began after lifting weights five days ago. No discrete injury. Current history of gout. EXAM: RIGHT WRIST - COMPLETE 3+ VIEW COMPARISON:  None. FINDINGS: No evidence of acute or subacute fracture or dislocation. Slight widening of the scapholunate joint space. Joint spaces otherwise well-preserved throughout. Linear calcification in the triangular fibrocartilage complex. Normal bone mineral density.  IMPRESSION: 1. Slight widening of the scapholunate joint space, query scapholunate ligament injury. Please correlate with focal tenderness. 2. Otherwise, no acute or subacute osseous abnormality. 3. Triangular fibrocartilage calcification which indicates CPPD or gout. Electronically Signed   By: Hulan Saashomas  Lawrence M.D.   On: 06/17/2018 09:27    ____________________________________________    PROCEDURES  Procedure(s) performed:    Procedures    Medications  ketorolac (TORADOL) 30 MG/ML injection 30 mg (30 mg Intramuscular Given 06/17/18 0912)  methylPREDNISolone sodium succinate (SOLU-MEDROL) 125 mg/2 mL injection 125 mg (125 mg Intramuscular Given 06/17/18 1004)     ____________________________________________   INITIAL IMPRESSION / ASSESSMENT AND PLAN / ED COURSE  Pertinent labs & imaging results that were available during my care of the patient were reviewed by me and considered in my medical decision making (see chart for details).  Review  of the [place] CSRS was performed in accordance of the [facility] prior to dispensing any controlled drugs.     Patient presented to the emergency department for evaluation of right wrist swelling and pain for 2 days.  Vital signs and exam are reassuring.  X-ray consistent with possible ligamentous injury or CPPD or gout.  Uric acid is normal but patient has been taking colchicine so this is expected.  Exam is most consistent with gout.  Patient will be placed in a wrist splint, in case he did do some injury while weightlifting a couple of days ago.  He will continue taking colchicine.  Patient will be discharged home with prescriptions for prednisone. Patient is to follow up with orthopedics as directed.  Patient already sees Dedra Skeens.  Patient is given ED precautions to return to the ED for any worsening or new symptoms.     ____________________________________________  FINAL CLINICAL IMPRESSION(S) / ED DIAGNOSES  Final diagnoses:  Right wrist  pain  History of gout  Injury of right wrist, initial encounter      NEW MEDICATIONS STARTED DURING THIS VISIT:  ED Discharge Orders         Ordered    predniSONE (DELTASONE) 10 MG tablet     06/17/18 1021              This chart was dictated using voice recognition software/Dragon. Despite best efforts to proofread, errors can occur which can change the meaning. Any change was purely unintentional.    Enid Derry, PA-C 06/17/18 1623    Willy Eddy, MD 06/18/18 [id number]

## 2018-06-17 NOTE — ED Triage Notes (Signed)
Pt c/o right wrist pain - denies injury but reports that a few weeks ago the wrist was sore and he went to the gym and worked out/the pain resolved on its own - since then the wrist has been swollen - pt has hx of gout - reports the pain started again Monday morning

## 2018-06-17 NOTE — ED Notes (Signed)
See triage note  Presents with pain to right wrist   States pain started about 3 days ago  Denies any injury  No deformity noted states he thought it was gout and took some meds for same  No relief

## 2018-06-17 NOTE — Discharge Instructions (Addendum)
I suspect that you have gout in your wrist.  Your x-ray indicates gout for a possible ligament injury.  Please begin prednisone and continue your colchicine.   Follow-up with Marcelline Matesodd Munday for further evaluations. Wear wrist splint until followup.

## 2018-06-17 NOTE — ED Notes (Signed)
FIRST NURSE NOTE: Pt c/o right wrist and hand pain for several days, states over the weekend he worked out lifting weights and states the pain started after that.  Pt has hx of gout as well.

## 2019-09-11 ENCOUNTER — Ambulatory Visit: Payer: PRIVATE HEALTH INSURANCE | Attending: Internal Medicine

## 2019-09-11 DIAGNOSIS — Z23 Encounter for immunization: Secondary | ICD-10-CM | POA: Insufficient documentation

## 2019-09-11 NOTE — Progress Notes (Signed)
   Covid-19 Vaccination Clinic  Name:  Greg Lowe    MRN: [id number] DOB: 04-05-1961  09/11/2019  Greg Lowe was observed post Covid-19 immunization for 15 minutes without incidence. He was provided with Vaccine Information Sheet and instruction to access the V-Safe system.   Greg Lowe was instructed to call 911 with any severe reactions post vaccine: Marland Kitchen Difficulty breathing  . Swelling of your face and throat  . A fast heartbeat  . A bad rash all over your body  . Dizziness and weakness    Immunizations Administered    Name Date Dose VIS Date Route   Moderna COVID-19 Vaccine 09/11/2019  1:09 PM 0.5 mL 06/15/2019 Intramuscular   Manufacturer: Moderna   Lot: [id number]   NDC: 81661-969-40

## 2019-10-07 ENCOUNTER — Ambulatory Visit: Payer: Self-pay

## 2019-10-09 ENCOUNTER — Ambulatory Visit: Payer: PRIVATE HEALTH INSURANCE | Attending: Internal Medicine

## 2019-10-09 DIAGNOSIS — Z23 Encounter for immunization: Secondary | ICD-10-CM

## 2019-10-09 NOTE — Progress Notes (Signed)
   Covid-19 Vaccination Clinic  Name:  Greg Lowe    MRN: [id number] DOB: 1960-08-01  10/09/2019  [name] was observed post Covid-19 immunization for 15 minutes without incident. He was provided with Vaccine Information Sheet and instruction to access the V-Safe system.   [name] was instructed to call 911 with any severe reactions post vaccine: Marland Kitchen Difficulty breathing  . Swelling of face and throat  . A fast heartbeat  . A bad rash all over body  . Dizziness and weakness   Immunizations Administered    Name Date Dose VIS Date Route   Moderna COVID-19 Vaccine 10/09/2019 12:07 PM 0.5 mL 06/15/2019 Intramuscular   Manufacturer: Gala Murdoch   Lot: [id number]   NDC: [id number]

## 2020-11-20 IMAGING — DX DG WRIST COMPLETE 3+V*R*
4 series · 4 of 4 positions shown · non-contrast
Comparison: None.

CLINICAL DATA: RIGHT wrist pain that began after lifting weights
five days ago. No discrete injury. Current history of gout.

EXAM:
RIGHT WRIST - COMPLETE 3+ VIEW

[wrist ap (1 of 2)]
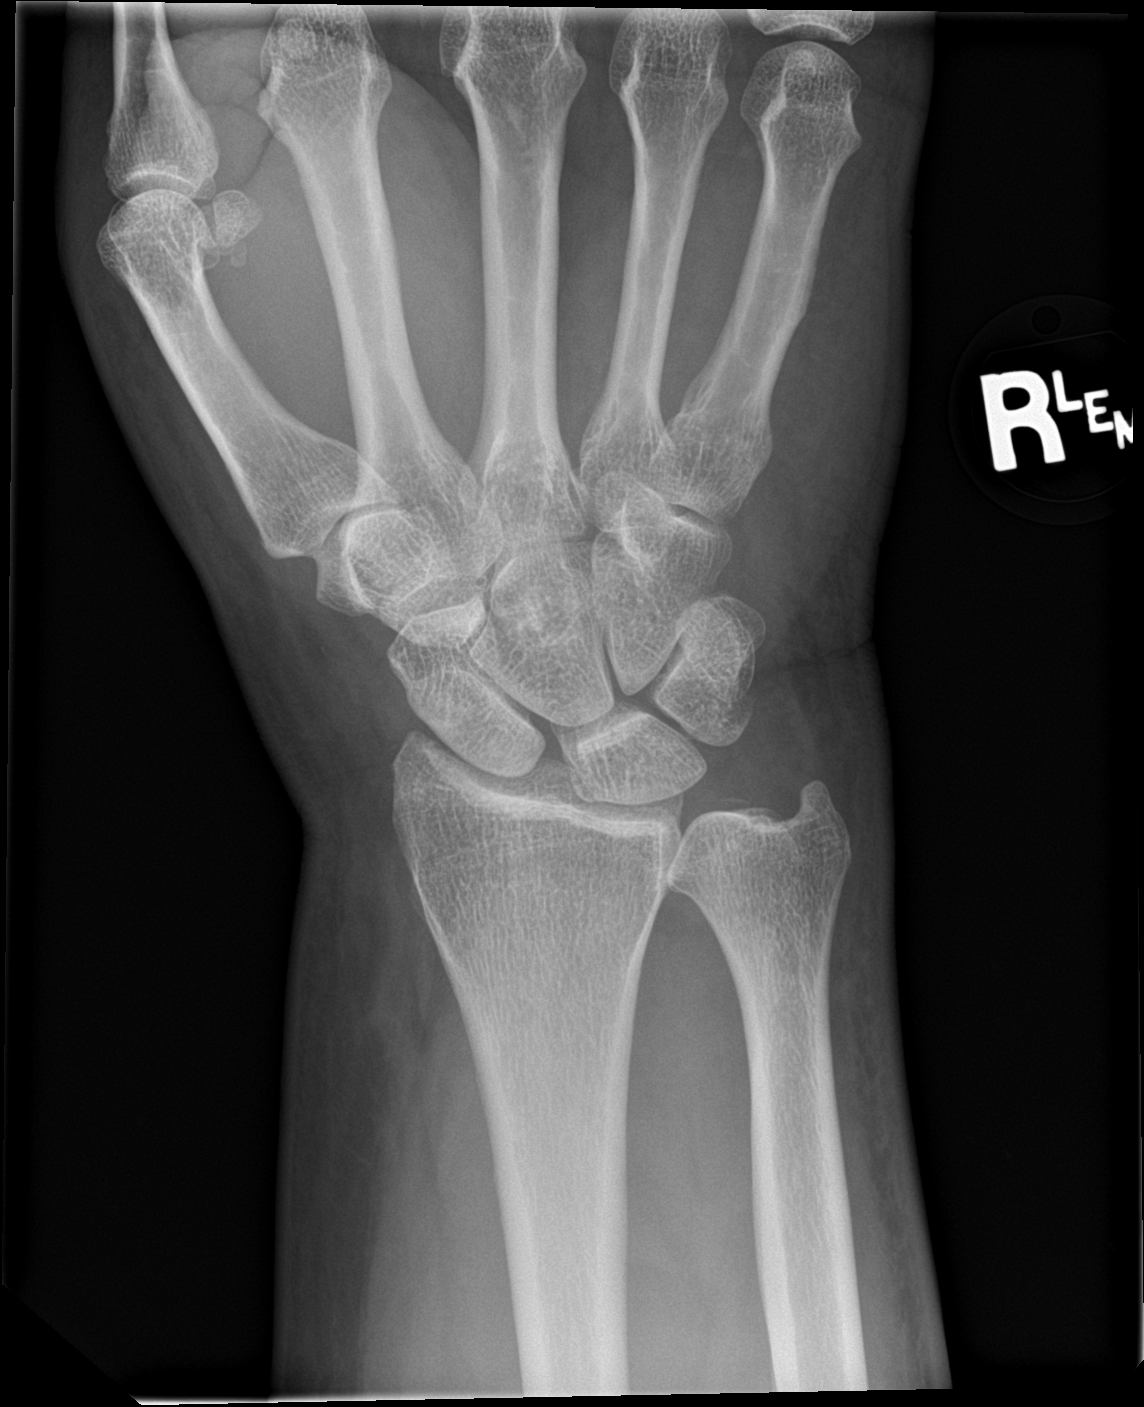

[wrist ap (2 of 2)]
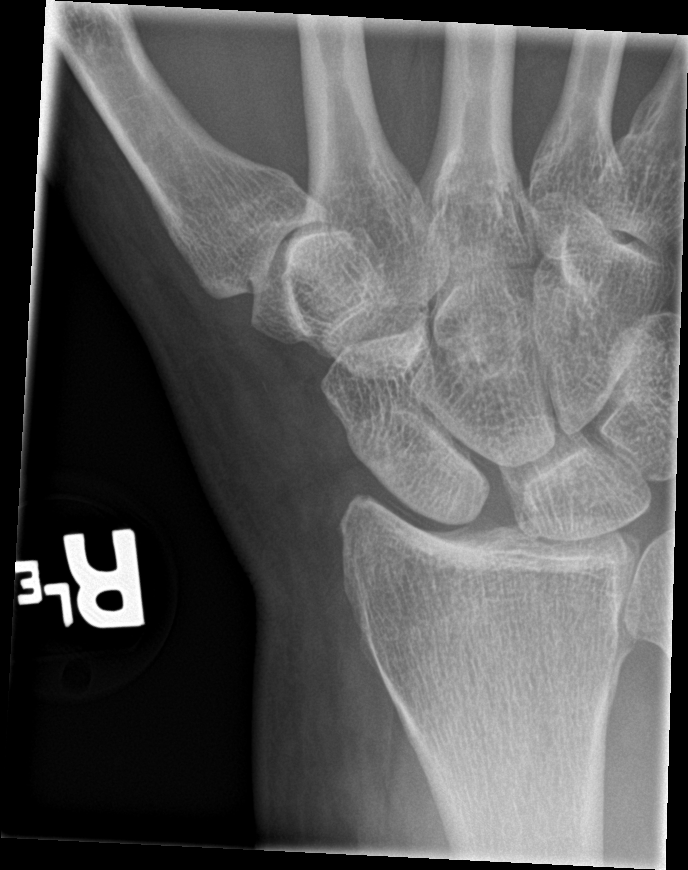

[wrist lat]
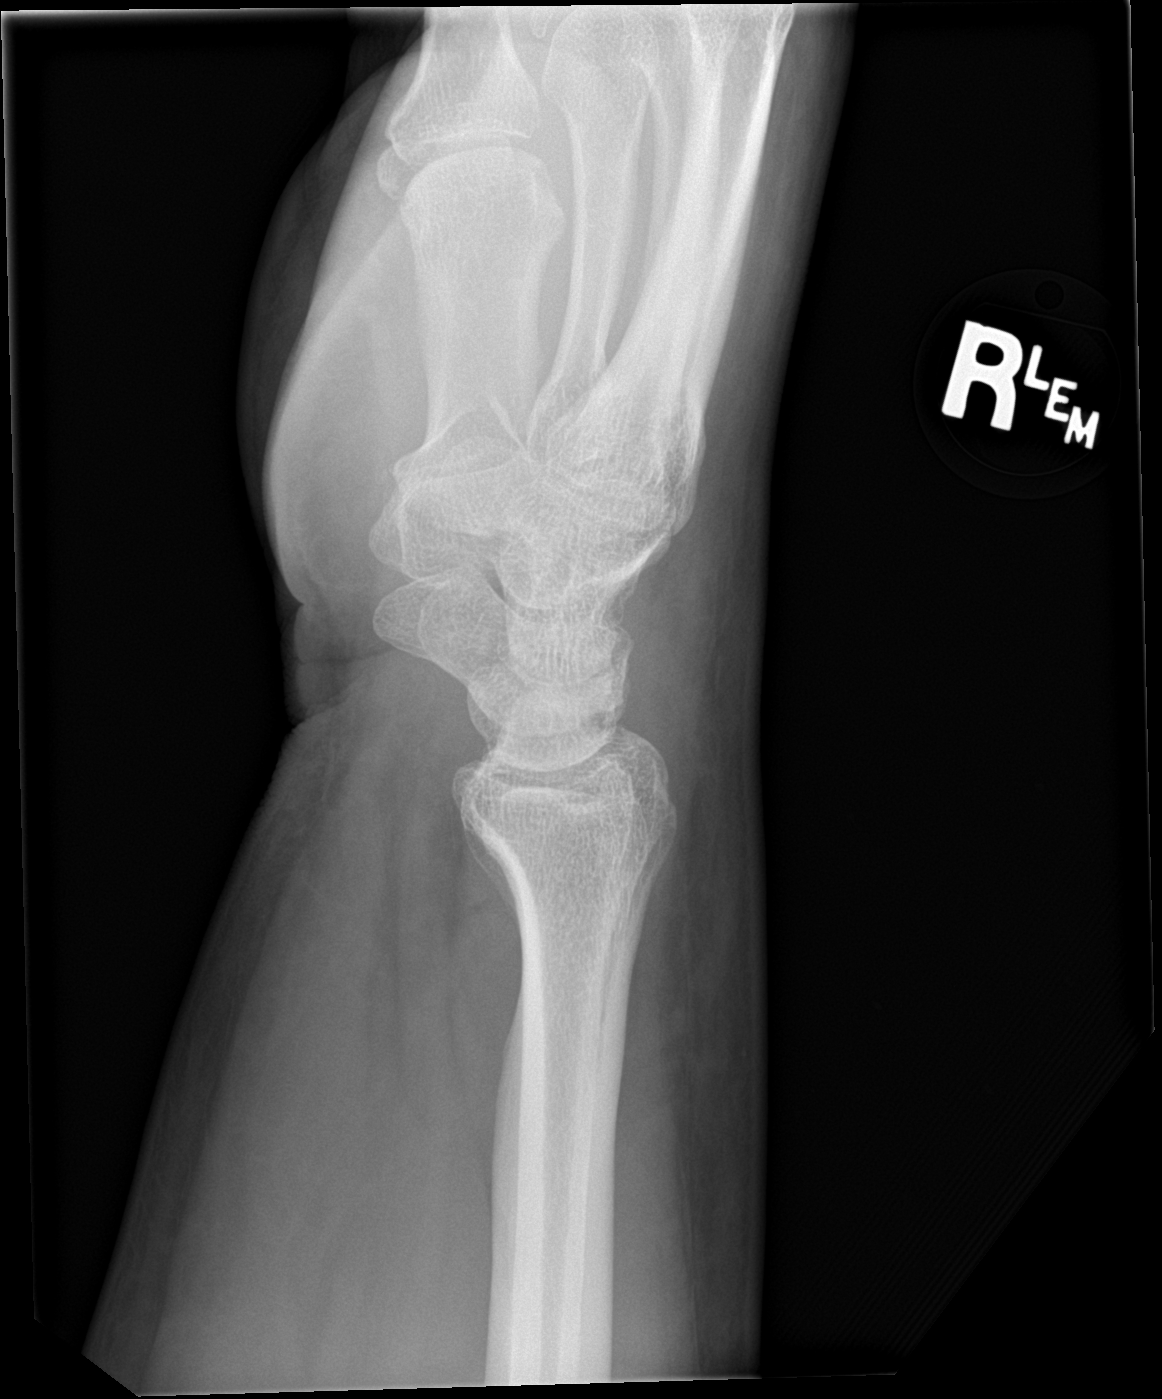

[wrist obl]
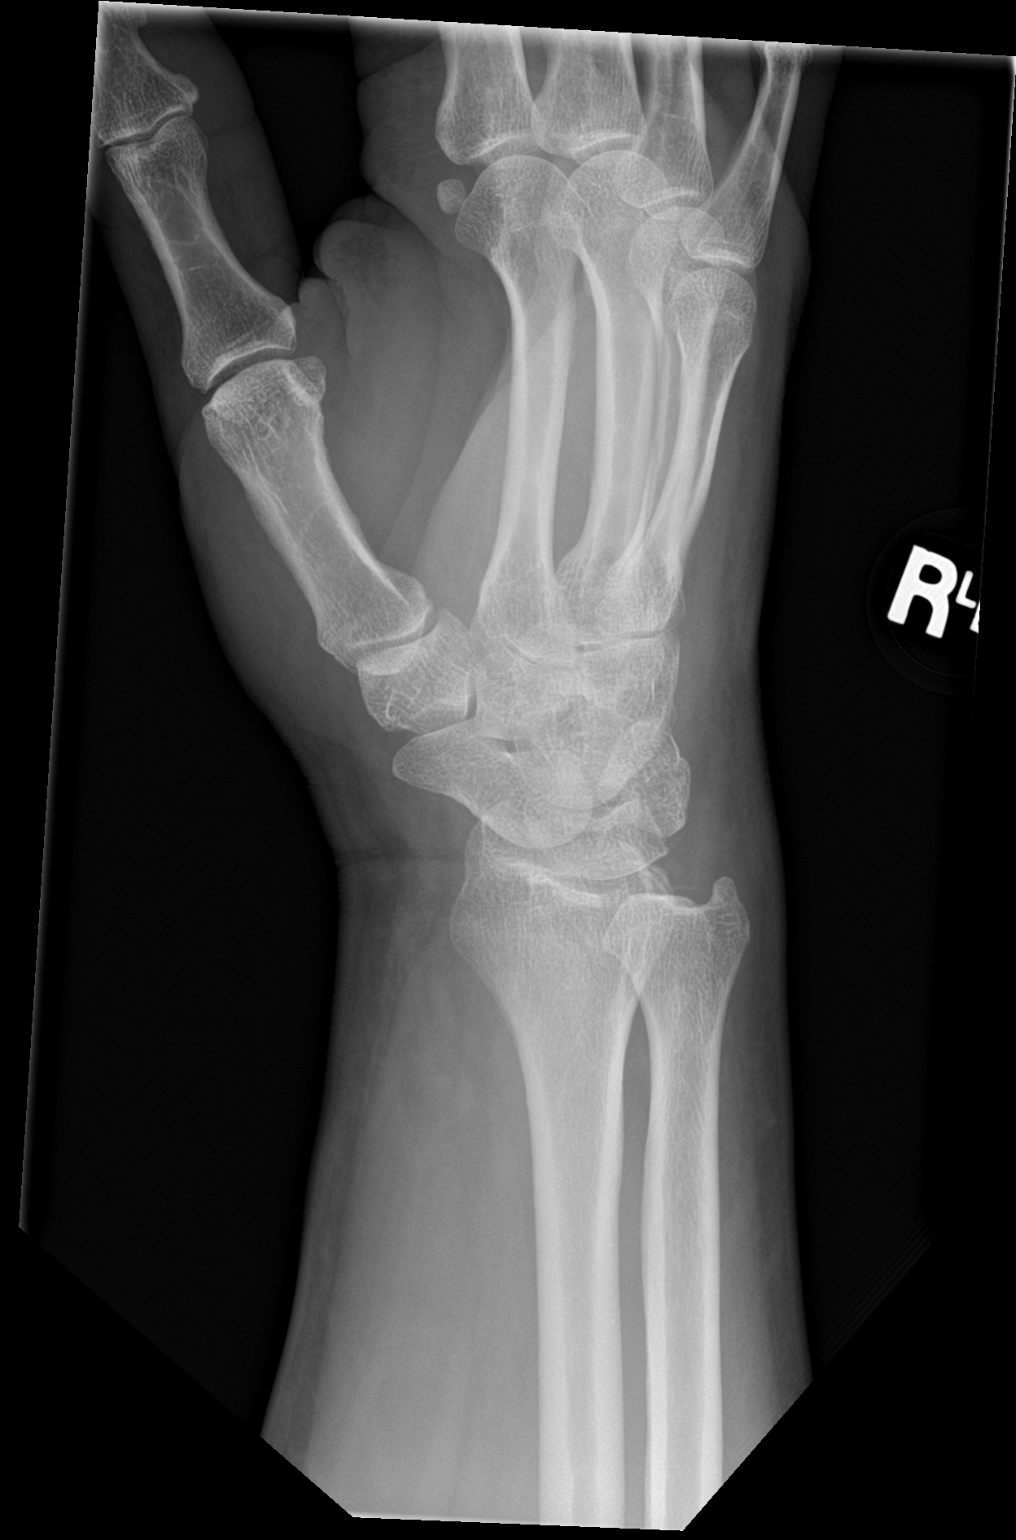

[4 of 4 positions shown; findings below may reference images not displayed]

FINDINGS: No evidence of acute or subacute fracture or dislocation. Slight
widening of the scapholunate joint space. Joint spaces otherwise
well-preserved throughout. Linear calcification in the triangular
fibrocartilage complex. Normal bone mineral density.
IMPRESSION: 1. Slight widening of the scapholunate joint space, query
scapholunate ligament injury. Please correlate with focal
tenderness.
2. Otherwise, no acute or subacute osseous abnormality.
3. Triangular fibrocartilage calcification which indicates CPPD or
gout.

## 2021-09-13 ENCOUNTER — Observation Stay
Admit: 2021-09-13 | Discharge: 2021-09-13 | Disposition: A | Discharge status: Home / Self Care | Payer: BLUE CROSS/BLUE SHIELD | Attending: Internal Medicine | Admitting: Internal Medicine

## 2021-09-13 ENCOUNTER — Encounter: Payer: Self-pay | Admitting: Emergency Medicine

## 2021-09-13 ENCOUNTER — Emergency Department: Payer: BLUE CROSS/BLUE SHIELD

## 2021-09-13 ENCOUNTER — Other Ambulatory Visit: Payer: Self-pay

## 2021-09-13 ENCOUNTER — Observation Stay
Admission: EM | Admit: 2021-09-13 | Discharge: 2021-09-13 | Disposition: A | Discharge status: Home / Self Care | Payer: BLUE CROSS/BLUE SHIELD | Attending: Internal Medicine | Admitting: Internal Medicine

## 2021-09-13 DIAGNOSIS — Z955 Presence of coronary angioplasty implant and graft: Secondary | ICD-10-CM | POA: Insufficient documentation

## 2021-09-13 DIAGNOSIS — I2510 Atherosclerotic heart disease of native coronary artery without angina pectoris: Secondary | ICD-10-CM | POA: Diagnosis not present

## 2021-09-13 DIAGNOSIS — I10 Essential (primary) hypertension: Secondary | ICD-10-CM | POA: Diagnosis not present

## 2021-09-13 DIAGNOSIS — R079 Chest pain, unspecified: Secondary | ICD-10-CM | POA: Diagnosis present

## 2021-09-13 DIAGNOSIS — Z20822 Contact with and (suspected) exposure to covid-19: Secondary | ICD-10-CM | POA: Insufficient documentation

## 2021-09-13 DIAGNOSIS — I249 Acute ischemic heart disease, unspecified: Secondary | ICD-10-CM | POA: Diagnosis present

## 2021-09-13 DIAGNOSIS — I214 Non-ST elevation (NSTEMI) myocardial infarction: Principal | ICD-10-CM | POA: Insufficient documentation

## 2021-09-13 DIAGNOSIS — Z7982 Long term (current) use of aspirin: Secondary | ICD-10-CM | POA: Insufficient documentation

## 2021-09-13 DIAGNOSIS — M109 Gout, unspecified: Secondary | ICD-10-CM | POA: Diagnosis present

## 2021-09-13 DIAGNOSIS — E669 Obesity, unspecified: Secondary | ICD-10-CM | POA: Diagnosis present

## 2021-09-13 LAB — TROPONIN I (HIGH SENSITIVITY)
Troponin I (High Sensitivity): 38 ng/L — ABNORMAL HIGH (ref <18)
Troponin I (High Sensitivity): 45 ng/L — ABNORMAL HIGH (ref <18)
Troponin I (High Sensitivity): 94 ng/L — ABNORMAL HIGH (ref <18)
Troponin I (High Sensitivity): 140 ng/L (ref <18)

## 2021-09-13 LAB — CBC
HCT: 41.9 % (ref 39.0–52.0)
Hemoglobin: 13.6 g/dL (ref 13.0–17.0)
MCH: 28.9 pg (ref 26.0–34.0)
MCHC: 32.5 g/dL (ref 30.0–36.0)
MCV: 89 fL (ref 80.0–100.0)
Platelets: 225 10*3/uL (ref 150–400)
RBC: 4.71 MIL/uL (ref 4.22–5.81)
RDW: 12.8 % (ref 11.5–15.5)
WBC: 6.5 10*3/uL (ref 4.0–10.5)
nRBC: 0 % (ref 0.0–0.2)

## 2021-09-13 LAB — ECHOCARDIOGRAM COMPLETE
AR max vel: 2.7 cm2
AV Area VTI: 3.23 cm2
AV Area mean vel: 2.53 cm2
AV Mean grad: 1.5 mmHg
AV Peak grad: 2.5 mmHg
Ao pk vel: 0.8 m/s
Area-P 1/2: 3.97 cm2
Height: 74 in
MV VTI: 1.41 cm2
S' Lateral: 3.3 cm
Weight: 4880 oz

## 2021-09-13 LAB — BRAIN NATRIURETIC PEPTIDE: B Natriuretic Peptide: 48.3 pg/mL (ref 0.0–100.0)

## 2021-09-13 LAB — BASIC METABOLIC PANEL
Anion gap: 8 (ref 5–15)
BUN: 35 mg/dL — ABNORMAL HIGH (ref 6–20)
CO2: 25 mmol/L (ref 22–32)
Calcium: 9 mg/dL (ref 8.9–10.3)
Chloride: 108 mmol/L (ref 98–111)
Creatinine, Ser: 1.55 mg/dL — ABNORMAL HIGH (ref 0.61–1.24)
GFR, Estimated: 51 mL/min — ABNORMAL LOW (ref >60)
Glucose, Bld: 112 mg/dL — ABNORMAL HIGH (ref 70–99)
Potassium: 4.2 mmol/L (ref 3.5–5.1)
Sodium: 141 mmol/L (ref 135–145)

## 2021-09-13 LAB — RESP PANEL BY RT-PCR (FLU A&B, COVID) ARPGX2
Influenza A by PCR: NEGATIVE
Influenza B by PCR: NEGATIVE
SARS Coronavirus 2 by RT PCR: NEGATIVE

## 2021-09-13 LAB — PROTIME-INR
INR: 1.1 (ref 0.8–1.2)
Prothrombin Time: 14.1 seconds (ref 11.4–15.2)

## 2021-09-13 LAB — HIV ANTIBODY (ROUTINE TESTING W REFLEX): HIV Screen 4th Generation wRfx: NONREACTIVE

## 2021-09-13 LAB — HEPARIN LEVEL (UNFRACTIONATED): Heparin Unfractionated: 0.25 IU/mL — ABNORMAL LOW (ref 0.30–0.70)

## 2021-09-13 LAB — APTT: aPTT: 59 seconds — ABNORMAL HIGH (ref 24–36)

## 2021-09-13 MED ORDER — ROSUVASTATIN CALCIUM 20 MG PO TABS
10.0000 mg | ORAL_TABLET | Freq: Every evening | ORAL | Status: DC
  Filled 2021-09-13: qty 1

## 2021-09-13 MED ORDER — ASPIRIN 81 MG PO CHEW
324.0000 mg | CHEWABLE_TABLET | Freq: Once | ORAL | Status: DC
  Filled 2021-09-13: qty 4

## 2021-09-13 MED ORDER — HEPARIN BOLUS VIA INFUSION
4000.0000 [IU] | Freq: Once | INTRAVENOUS | Status: AC
  Administered 2021-09-13: 4000 [IU] via INTRAVENOUS
  Filled 2021-09-13: qty 4000

## 2021-09-13 MED ORDER — HYDROCHLOROTHIAZIDE 25 MG PO TABS
25.0000 mg | ORAL_TABLET | Freq: Every day | ORAL | Status: DC
  Administered 2021-09-13: 25 mg via ORAL
  Filled 2021-09-13: qty 1

## 2021-09-13 MED ORDER — AMLODIPINE BESYLATE 5 MG PO TABS
5.0000 mg | ORAL_TABLET | Freq: Every day | ORAL | Status: DC
  Administered 2021-09-13: 5 mg via ORAL
  Filled 2021-09-13: qty 1

## 2021-09-13 MED ORDER — HEPARIN (PORCINE) 25000 UT/250ML-% IV SOLN
1800.0000 [IU]/h | INTRAVENOUS | Status: DC
  Administered 2021-09-13: 1600 [IU]/h via INTRAVENOUS
  Filled 2021-09-13: qty 250

## 2021-09-13 MED ORDER — ASPIRIN EC 81 MG PO TBEC
81.0000 mg | DELAYED_RELEASE_TABLET | Freq: Every day | ORAL | Status: DC
Start: 2021-09-14 — End: 2021-09-13

## 2021-09-13 MED ORDER — ACETAMINOPHEN 325 MG PO TABS: 650.0000 mg | ORAL_TABLET | ORAL | Status: DC | PRN

## 2021-09-13 MED ORDER — CARVEDILOL 6.25 MG PO TABS
50.0000 mg | ORAL_TABLET | Freq: Two times a day (BID) | ORAL | Status: DC
Start: 2021-09-13 — End: 2021-09-13

## 2021-09-13 MED ORDER — ASPIRIN 325 MG PO TABS: 325.0000 mg | ORAL_TABLET | Freq: Every day | ORAL | Status: DC

## 2021-09-13 MED ORDER — ONDANSETRON HCL 4 MG/2ML IJ SOLN: 4.0000 mg | Freq: Four times a day (QID) | INTRAMUSCULAR | Status: DC | PRN

## 2021-09-13 MED ORDER — ADULT MULTIVITAMIN W/MINERALS CH
1.0000 | ORAL_TABLET | Freq: Every day | ORAL | Status: DC
  Administered 2021-09-13: 1 via ORAL
  Filled 2021-09-13: qty 1

## 2021-09-13 MED ORDER — COLCHICINE 0.6 MG PO TABS
0.6000 mg | ORAL_TABLET | Freq: Two times a day (BID) | ORAL | Status: DC | PRN
  Filled 2021-09-13: qty 1

## 2021-09-13 MED ORDER — NITROGLYCERIN 0.4 MG SL SUBL
0.4000 mg | SUBLINGUAL_TABLET | SUBLINGUAL | Status: DC | PRN
  Filled 2021-09-13: qty 1

## 2021-09-13 MED ORDER — ALLOPURINOL 300 MG PO TABS
300.0000 mg | ORAL_TABLET | Freq: Every day | ORAL | Status: DC
  Administered 2021-09-13: 300 mg via ORAL
  Filled 2021-09-13: qty 1

## 2021-09-13 NOTE — Discharge Summary (Signed)
Physician Discharge Summary   Patient: Greg Lowe MRN: [id number] DOB: [birth year]  Admit date:     09/13/2021  Discharge date: 09/13/21  Discharge Physician: Momin Misko   PCP: Danella Penton, MD   Recommendations at discharge:   Follow-up with Dr. Darrold Junker upon discharge for an outpatient stress test  Discharge Diagnoses: Principal Problem:   ACS (acute coronary syndrome) (HCC) Active Problems:   Hypertension   Obesity (BMI 30-39.9)   Gout  Resolved Problems:   * No resolved hospital problems. [facility] Course: No notes on file  Assessment and Plan: * ACS (acute coronary syndrome) [facility]) Patient with a known history of coronary artery disease status post CABG in 2009 who presents to the ER for evaluation of midsternal chest pain with radiation to the jaw. He has an uptrending troponin level but is currently chest pain-free. Continue heparin drip initiated in the ER. Continue aspirin, beta-blockers and statins. Obtain 2D echocardiogram to assess LVEF and rule out regional wall motion abnormality. We will request cardiology consult  Gout Stable Not acutely exacerbated Continue allopurinol and as needed colchicine  Obesity (BMI 30-39.9) Complicates overall prognosis and care Lifestyle modification and exercise has been discussed with patient in detail  Hypertension Blood pressure stable Continue amlodipine, hydrochlorothiazide and carvedilol           Consultants: Dr Darrold Junker Procedures performed: None  Disposition: Home Diet recommendation:  Discharge Diet Orders (From admission, onward)     Start     Ordered   09/13/21 0000  Diet - low sodium heart healthy        09/13/21 1658           Cardiac diet  DISCHARGE MEDICATION: Allergies as of 09/13/2021   No Known Allergies      Medication List     STOP taking these medications    ibuprofen 600 MG tablet Commonly known as: ADVIL   naproxen 500 MG tablet Commonly known as:  NAPROSYN   predniSONE 10 MG tablet Commonly known as: DELTASONE   saw palmetto 160 MG capsule       TAKE these medications    allopurinol 300 MG tablet Commonly known as: ZYLOPRIM Take 300 mg by mouth daily. What changed: Another medication with the same name was removed. Continue taking this medication, and follow the directions you see here.   amLODipine 5 MG tablet Commonly known as: NORVASC Take 5 mg by mouth daily.   aspirin 325 MG tablet Take 325 mg by mouth daily.   carvedilol 25 MG tablet Commonly known as: COREG Take 50 mg by mouth 2 (two) times daily with a meal.   colchicine 0.6 MG tablet Take 0.6 mg by mouth 2 (two) times daily.   hydrochlorothiazide 25 MG tablet Commonly known as: HYDRODIURIL Take 25 mg by mouth daily.   multivitamin capsule Take 1 capsule by mouth daily.   rosuvastatin 10 MG tablet Commonly known as: CRESTOR Take 10 mg by mouth daily.        Follow-up Information     Paraschos, Alexander, MD. Go in 1 week(s).   Specialty: Cardiology Why: 1-2 weeks after discharge. Contact information: [address] [facility] [place] [address] [phone]                 Discharge Exam: Ceasar Mons Weights   09/13/21 0551  Weight: (!) 138.3 kg   Physical Exam Vitals and nursing note reviewed.  Constitutional:      Appearance:  He is well-developed. He is obese.  HENT:     Head: Normocephalic and atraumatic.  Eyes:     Pupils: Pupils are equal, round, and reactive to light.  Cardiovascular:     Rate and Rhythm: Bradycardia present.     Heart sounds: Normal heart sounds.  Pulmonary:     Effort: Pulmonary effort is normal.     Breath sounds: Normal breath sounds.  Abdominal:     General: Bowel sounds are normal.     Palpations: Abdomen is soft.  Musculoskeletal:        General: Normal range of motion.  Skin:    General: Skin is warm and dry.  Neurological:     General: No focal deficit  present.     Mental Status: He is alert.  Psychiatric:        Mood and Affect: Mood normal.        Behavior: Behavior normal.     Condition at discharge: stable  The results of significant diagnostics from this hospitalization (including imaging, microbiology, ancillary and laboratory) are listed below for reference.   Imaging Studies: DG Chest Port 1 View  Result Date: 09/13/2021 CLINICAL DATA:  Chest pain. EXAM: PORTABLE CHEST 1 VIEW COMPARISON:  None. FINDINGS: 0613 hours. The lungs are clear without focal pneumonia, edema, pneumothorax or pleural effusion. There is pulmonary vascular congestion without overt pulmonary edema. The cardio pericardial silhouette is enlarged. Sternotomy wires noted over the region of the manubrium with 3 additional radiodensities projecting over the midline, indeterminate but likely related to prior surgery/treatment. IMPRESSION: Enlargement of the cardiopericardial silhouette with pulmonary vascular congestion. 3 radiodense structures overlie the midline. Correlation for thoracic spine surgery recommended. Lateral chest x-ray could be used to further evaluate as clinically warranted. Electronically Signed   By: Kennith Center M.D.   On: 09/13/2021 06:31   ECHOCARDIOGRAM COMPLETE  Result Date: 09/13/2021    ECHOCARDIOGRAM REPORT   Patient Name:   Greg Lowe Date of Exam: 09/13/2021 Medical Rec #:  [id number]        Height:       74.0 in Accession #:    [id number]       Weight:       305.0 lb Date of Birth:  22-Jan-1961       BSA:          2.600 m Patient Age:    60 years         BP:           149/92 mmHg Patient Gender: M                HR:           52 bpm. Exam Location:  [facility] Procedure: 2D Echo, Cardiac Doppler and Color Doppler Indications:     Chest pain R07.9  History:         Patient has no prior history of Echocardiogram examinations.                  CAD; Risk Factors:Hypertension. High cholesterol.  Sonographer:     Cristela Blue Referring Phys:  [id number]  [id number] Olen Eaves Diagnosing Phys: Marcina Millard MD  Sonographer Comments: Suboptimal apical window. IMPRESSIONS  1. Left ventricular ejection fraction, by estimation, is 60 to 65%. The left ventricle has normal function. The left ventricle has no regional wall motion abnormalities. There is mild left ventricular hypertrophy. Left ventricular diastolic parameters were normal.  2. Right ventricular systolic function is normal.  The right ventricular size is normal.  3. The mitral valve is normal in structure. Mild mitral valve regurgitation. No evidence of mitral stenosis.  4. The aortic valve is normal in structure. Aortic valve regurgitation is not visualized. No aortic stenosis is present.  5. The inferior vena cava is normal in size with greater than 50% respiratory variability, suggesting right atrial pressure of 3 mmHg. FINDINGS  Left Ventricle: Left ventricular ejection fraction, by estimation, is 60 to 65%. The left ventricle has normal function. The left ventricle has no regional wall motion abnormalities. The left ventricular internal cavity size was normal in size. There is  mild left ventricular hypertrophy. Left ventricular diastolic parameters were normal. Right Ventricle: The right ventricular size is normal. No increase in right ventricular wall thickness. Right ventricular systolic function is normal. Left Atrium: Left atrial size was normal in size. Right Atrium: Right atrial size was normal in size. Pericardium: There is no evidence of pericardial effusion. Mitral Valve: The mitral valve is normal in structure. Mild mitral valve regurgitation. No evidence of mitral valve stenosis. MV peak gradient, 2.9 mmHg. The mean mitral valve gradient is 1.0 mmHg. Tricuspid Valve: The tricuspid valve is normal in structure. Tricuspid valve regurgitation is mild . No evidence of tricuspid stenosis. Aortic Valve: The aortic valve is normal in structure. Aortic valve regurgitation is not visualized. No aortic  stenosis is present. Aortic valve mean gradient measures 1.5 mmHg. Aortic valve peak gradient measures 2.5 mmHg. Aortic valve area, by VTI measures 3.23 cm. Pulmonic Valve: The pulmonic valve was normal in structure. Pulmonic valve regurgitation is not visualized. No evidence of pulmonic stenosis. Aorta: The aortic root is normal in size and structure. Venous: The inferior vena cava is normal in size with greater than 50% respiratory variability, suggesting right atrial pressure of 3 mmHg. IAS/Shunts: No atrial level shunt detected by color flow Doppler.  LEFT VENTRICLE PLAX 2D LVIDd:         4.90 cm   Diastology LVIDs:         3.30 cm   LV e' medial:    6.74 cm/s LV PW:         1.70 cm   LV E/e' medial:  11.1 LV IVS:        1.50 cm   LV e' lateral:   11.40 cm/s LVOT diam:     2.00 cm   LV E/e' lateral: 6.6 LV SV:         45 LV SV Index:   17 LVOT Area:     3.14 cm  RIGHT VENTRICLE RV S prime:     10.08 cm/s TAPSE (M-mode): 1.2 cm LEFT ATRIUM              Index        RIGHT ATRIUM           Index LA diam:        4.50 cm  1.73 cm/m   RA Area:     26.20 cm LA Vol (A2C):   115.0 ml 44.22 ml/m  RA Volume:   86.80 ml  33.38 ml/m LA Vol (A4C):   68.5 ml  26.34 ml/m LA Biplane Vol: 96.5 ml  37.11 ml/m  AORTIC VALVE                    PULMONIC VALVE AV Area (Vmax):    2.70 cm     PV Vmax:        0.54  m/s AV Area (Vmean):   2.53 cm     PV Vmean:       40.400 cm/s AV Area (VTI):     3.23 cm     PV VTI:         0.129 m AV Vmax:           79.65 cm/s   PV Peak grad:   1.1 mmHg AV Vmean:          54.350 cm/s  PV Mean grad:   1.0 mmHg AV VTI:            0.138 m      RVOT Peak grad: 3 mmHg AV Peak Grad:      2.5 mmHg AV Mean Grad:      1.5 mmHg LVOT Vmax:         68.50 cm/s LVOT Vmean:        43.700 cm/s LVOT VTI:          0.142 m LVOT/AV VTI ratio: 1.03  AORTA Ao Root diam: 3.50 cm MITRAL VALVE MV Area (PHT): 3.97 cm    SHUNTS MV Area VTI:   1.41 cm    Systemic VTI:  0.14 m MV Peak grad:  2.9 mmHg    Systemic Diam:  2.00 cm MV Mean grad:  1.0 mmHg    Pulmonic VTI:  0.126 m MV Vmax:       0.85 m/s MV Vmean:      46.7 cm/s MV Decel Time: 191 msec MV E velocity: 75.00 cm/s MV A velocity: 56.10 cm/s MV E/A ratio:  1.34 Marcina Millard MD Electronically signed by Marcina Millard MD Signature Date/Time: 09/13/2021/4:36:20 PM    Final     Microbiology: Results for orders placed or performed during the hospital encounter of 09/13/21  Resp Panel by RT-PCR (Flu A&B, Covid) Nasopharyngeal Swab     Status: None   Collection Time: 09/13/21  6:10 AM   Specimen: Nasopharyngeal Swab; Nasopharyngeal(NP) swabs in vial transport medium  Result Value Ref Range Status   SARS Coronavirus 2 by RT PCR NEGATIVE NEGATIVE Final    Comment: (NOTE) SARS-CoV-2 target nucleic acids are NOT DETECTED.  The SARS-CoV-2 RNA is generally detectable in upper respiratory specimens during the acute phase of infection. The lowest concentration of SARS-CoV-2 viral copies this assay can detect is 138 copies/mL. A negative result does not preclude SARS-Cov-2 infection and should not be used as the sole basis for treatment or other patient management decisions. A negative result may occur with  improper specimen collection/handling, submission of specimen other than nasopharyngeal swab, presence of viral mutation(s) within the areas targeted by this assay, and inadequate number of viral copies(<138 copies/mL). A negative result must be combined with clinical observations, patient history, and epidemiological information. The expected result is Negative.  Fact Sheet for Patients:  BloggerCourse.com  Fact Sheet for Healthcare Providers:  SeriousBroker.it  This test is no t yet approved or cleared by the [place] FDA and  has been authorized for detection and/or diagnosis of SARS-CoV-2 by FDA under an Emergency Use Authorization (EUA). This EUA will remain  in effect (meaning this  test can be used) for the duration of the COVID-19 declaration under Section 564(b)(1) of the Act, 21 U.S.C.section 360bbb-3(b)(1), unless the authorization is terminated  or revoked sooner.       Influenza A by PCR NEGATIVE NEGATIVE Final   Influenza B by PCR NEGATIVE NEGATIVE Final    Comment: (NOTE) The Xpert  Xpress SARS-CoV-2/FLU/RSV plus assay is intended as an aid in the diagnosis of influenza from Nasopharyngeal swab specimens and should not be used as a sole basis for treatment. Nasal washings and aspirates are unacceptable for Xpert Xpress SARS-CoV-2/FLU/RSV testing.  Fact Sheet for Patients: BloggerCourse.comhttps://www.fda.gov/media/152166/download  Fact Sheet for Healthcare Providers: SeriousBroker.ithttps://www.fda.gov/media/152162/download  This test is not yet approved or cleared by the [place] FDA and has been authorized for detection and/or diagnosis of SARS-CoV-2 by FDA under an Emergency Use Authorization (EUA). This EUA will remain in effect (meaning this test can be used) for the duration of the COVID-19 declaration under Section 564(b)(1) of the Act, 21 U.S.C. section 360bbb-3(b)(1), unless the authorization is terminated or revoked.  Performed at [facility], [address] [place], [place] [id number]     Labs: CBC: Recent Labs  Lab 09/13/21 0610  WBC 6.5  HGB 13.6  HCT 41.9  MCV 89.0  PLT 225   Basic Metabolic Panel: Recent Labs  Lab 09/13/21 0610  NA 141  K 4.2  CL 108  CO2 25  GLUCOSE 112*  BUN 35*  CREATININE 1.55*  CALCIUM 9.0   Liver Function Tests: No results for input(s): AST, ALT, ALKPHOS, BILITOT, PROT, ALBUMIN in the last 168 hours. CBG: No results for input(s): GLUCAP in the last 168 hours.  Discharge time spent: greater than 30 minutes.  Signed: Lucile Shuttersochukwu Seeley Hissong, MD [facility] 09/13/2021

## 2021-09-13 NOTE — Consult Note (Addendum)
ANTICOAGULATION CONSULT NOTE - Initial Consult ? ?Pharmacy Consult for heparin infusion ?Indication: chest pain/ACS ? ?No Known Allergies ? ?Patient Measurements: ?Height: 6\' 2"  (188 cm) ?Weight: (!) 138.3 kg (305 lb) ?IBW/kg (Calculated) : 82.2 ?Heparin Dosing Weight: 113.4 kg ? ?Vital Signs: ?Temp: 98.2 ?F (36.8 ?C) (03/02 0604) ?Temp Source: Oral (03/02 05-22-1992) ?BP: 131/81 (03/02 1100) ?Pulse Rate: 56 (03/02 1100) ? ?Labs: ?Recent Labs  ?  09/13/21 ?0610 09/13/21 ?0727 09/13/21 ?1206  ?HGB 13.6  --   --   ?HCT 41.9  --   --   ?PLT 225  --   --   ?APTT  --   --  59*  ?LABPROT  --   --  14.1  ?INR  --   --  1.1  ?HEPARINUNFRC  --   --  0.25*  ?CREATININE 1.55*  --   --   ?TROPONINIHS 38* 45*  --   ? ? ? ?Estimated Creatinine Clearance: 75.9 mL/min (A) (by C-G formula based on SCr of 1.55 mg/dL (H)). ? ? ?Medical History: ?Past Medical History:  ?Diagnosis Date  ? Cervicalgia   ? Chicken pox   ? Coronary artery disease   ? High cholesterol   ? Hypertension   ? ? ?Medications:  ?No prior anticoagulation noted  ? ?Assessment: ?61 y.o. male with medical history significant for coronary artery disease status post CABG, obesity, HTN, and HLD  presented  to the ER for evaluation of midsternal chest pain. Troponin I 38 > 45. Pharmacy has been consulted for heparin infusion management.  ? ?3/02 @1206  HL 0.25, subtherapeutic; drawn ~2 hrs early ? ?Goal of Therapy:  ?Heparin level 0.3-0.7 units/ml ?Monitor platelets by anticoagulation protocol: Yes ?  ?Plan:  ?Heparin level subtherapeutic. Since level was drawn early, will hold off on bolus and just increase heparin infusion to 1800 units/hr ?Check heparin level 6 hours after rate change ?Continue to monitor H&H and platelets ? ?5/02, PharmD, BCPS ?Clinical Pharmacist   ?09/13/2021,12:44 PM ? ? ?

## 2021-09-13 NOTE — Assessment & Plan Note (Signed)
Patient with a known history of coronary artery disease status post CABG in 2009 who presents to the ER for evaluation of midsternal chest pain with radiation to the jaw. ?He has an uptrending troponin level but is currently chest pain-free. ?Continue heparin drip initiated in the ER. ?Continue aspirin, beta-blockers and statins. ?Obtain 2D echocardiogram to assess LVEF and rule out regional wall motion abnormality. ?We will request cardiology consult ?

## 2021-09-13 NOTE — Consult Note (Cosign Needed)
[place] NOTE       Patient ID: Greg Lowe MRN: [id number] DOB/AGE: 1961/07/08 61 y.o.  Admit date: 09/13/2021 Referring Physician Dr. Francine Graven Primary Physician Dr. Emily Filbert Primary Cardiologist Dr. Saralyn Pilar (seen last 2008)  Reason for Consultation chest pain  HPI: The patient is a 61 year old male with a past medical history notable for CAD s/p CABG x3 in 2009, hypertension, hyperlipidemia CKD, obesity who presented to [facility] ED 09/13/2021 with chest tightness.  Cardiology was consulted for further assistance.  The patient presents today with his wife and he states that this morning around 3 AM he woke up and realized he had not taken his evening medications (aspirin, naproxen, amlodipine) so he drank some lemonade and a lot of water and took his medications.  Afterwards he started having some chest tightness that somewhat resolved after he burped but reoccurred about 10 minutes later with radiation to his jaw.  He called his primary care provider Dr. Sabra Heck in the morning who advised him to come to the ER.  By the time he was at the ER his pain had resolved and has not reoccurred since.  He denies associated shortness of breath, diaphoresis, exertional or any aggravating symptoms of his chest tightness.  Denies palpitations, presyncope, lower extremity edema.  He says last night he drank about 4 lemon sodas and was questioning if his chest tightness could be related to indigestion.  Of note, the patient is very active and walks 2 miles almost every morning with reported peak speed of 4.0 on the treadmill, most recently 2 days ago and denies any chest tightness or similar pain while he has been exercising.  He has lost about 60 pounds over the past year with diet and exercise routine. He is the [employer] of a [employer].  He states that preceding his CABG he was having worsening shortness of breath and chest discomfort for which his primary care  ordered an exercise treadmill test in 2008, had subsequent cardiac catheterization with Dr. Saralyn Pilar which revealed three-vessel disease for which CABG was recommended.  Since his CABG he has not had any surveillance testing.   Vitals on admission are notable for a blood pressure peaking at 169/95, heart rate 57.  Labs are notable for calcium 4.2, creatinine 1.55, EGFR 51 (appear to be at baseline) BNP normal at 48.3, troponins 38-45 with repeats pending.  H&H 13.6/41.9, platelets 225, respiratory panel negative for influenza and coronavirus  Review of systems complete and found to be negative unless listed above     Past Medical History:  Diagnosis Date   Cervicalgia    Chicken pox    Coronary artery disease    High cholesterol    Hypertension     Past Surgical History:  Procedure Laterality Date   BYPASS GRAFT     triple   COLONOSCOPY WITH PROPOFOL N/A 09/19/2015   Procedure: COLONOSCOPY WITH PROPOFOL;  Surgeon: Lollie Sails, MD;  Location: [facility] ENDOSCOPY;  Service: Endoscopy;  Laterality: N/A;   CORONARY ARTERY BYPASS GRAFT     right forearm surgery      (Not in a hospital admission)  Social History   Socioeconomic History   Marital status: Married    Spouse name: Not on file   Number of children: Not on file   Years of education: Not on file   Highest education level: Not on file  Occupational History   Not on file  Tobacco Use   Smoking  status: Never   Smokeless tobacco: Never  Vaping Use   Vaping Use: Never used  Substance and Sexual Activity   Alcohol use: No   Drug use: No   Sexual activity: Yes  Other Topics Concern   Not on file  Social History Narrative   Not on file   Social Determinants of Health   Financial Resource Strain: Not on file  Food Insecurity: Not on file  Transportation Needs: Not on file  Physical Activity: Not on file  Stress: Not on file  Social Connections: Not on file  Intimate Partner Violence: Not on file    No family  history on file.    Review of systems complete and found to be negative unless listed above    PHYSICAL EXAM General: Pleasant middle-aged black male, well nourished, in no acute distress.  Sitting upright in ED stretcher with wife at bedside. HEENT:  Normocephalic and atraumatic. Neck:  No JVD.  Lungs: Normal respiratory effort on room air. Clear bilaterally to auscultation. No wheezes, crackles, rhonchi.  Heart: Bradycardic RRR . Normal S1 and S2 without gallops or murmurs. Radial & DP pulses 2+ bilaterally. Abdomen: Obese appearing.  Msk: Normal strength and tone for age. Extremities: Warm and well perfused. No clubbing, cyanosis.  No peripheral edema.  Neuro: Alert and oriented X 3. Psych:  Answers questions appropriately.   Labs:   Lab Results  Component Value Date   WBC 6.5 09/13/2021   HGB 13.6 09/13/2021   HCT 41.9 09/13/2021   MCV 89.0 09/13/2021   PLT 225 09/13/2021    Recent Labs  Lab 09/13/21 0610  NA 141  K 4.2  CL 108  CO2 25  BUN 35*  CREATININE 1.55*  CALCIUM 9.0  GLUCOSE 112*   Lab Results  Component Value Date   WJXBJYN 829 (H) 06/30/2015   CKMB 1.2 09/05/2011   TROPONINI < 0.02 09/06/2011   No results found for: CHOL No results found for: HDL No results found for: LDLCALC No results found for: TRIG No results found for: CHOLHDL No results found for: LDLDIRECT    Radiology: [facility] Chest Port 1 View  Result Date: 09/13/2021 CLINICAL DATA:  Chest pain. EXAM: PORTABLE CHEST 1 VIEW COMPARISON:  None. FINDINGS: 0613 hours. The lungs are clear without focal pneumonia, edema, pneumothorax or pleural effusion. There is pulmonary vascular congestion without overt pulmonary edema. The cardio pericardial silhouette is enlarged. Sternotomy wires noted over the region of the manubrium with 3 additional radiodensities projecting over the midline, indeterminate but likely related to prior surgery/treatment. IMPRESSION: Enlargement of the cardiopericardial  silhouette with pulmonary vascular congestion. 3 radiodense structures overlie the midline. Correlation for thoracic spine surgery recommended. Lateral chest x-ray could be used to further evaluate as clinically warranted. Electronically Signed   By: Misty Stanley M.D.   On: 09/13/2021 06:31    ECHO 11/18/2007 NORMAL LEFT VENTRICULAR SYSTOLIC FUNCTION WITH MODERATE LVH    NORMAL RIGHT VENTRICULAR SYSTOLIC FUNCTION    VALVULAR REGURGITATION: TRIVIAL MR, TRIVIAL PR    NO VALVULAR STENOSIS    LV APPEARS UNDERFILLED   TELEMETRY reviewed by me: Sinus bradycardia rate between 52-56, some PVCs  EKG reviewed by me: Sinus bradycardia rate 53, first-degree AV block, similar appearance to prior from 2016  ASSESSMENT AND PLAN:  The patient is a 61 year old male with a past medical history notable for CAD s/p CABG x3 in 2009, hypertension, hyperlipidemia CKD, obesity who presented to [facility] ED 09/13/2021 with chest tightness.  Cardiology  was consulted for further assistance.  #Atypical chest pain #Mildly elevated troponin #CAD s/p CABG x3 in 2009 The patient presents with 2 episodes of chest tightness, the second with pain radiating to his jaw that are nonexertional in nature and somewhat relieved with belching.  He is very active at baseline and walks 2 miles most days (most recently 2 days ago) without chest tightness, or limitations in his activity.  His high-sensitivity troponin is mildly elevated to 38-45 with repeats pending, EKG is without acute ST changes and he has had no reoccurrence of his chest tightness since being in the ED.  It is unlikely his chest pain is related to ACS however since he has a history of CABG, we will order an echo today.  -trend troponins until peak  -S/p 325 mg aspirin, continue 81 mg aspirin indefinitely -Continue home Coreg 50 mg twice daily -Continue amlodipine 5 mg once daily -Continue HCTZ 25 mg once daily -continue crestor $RemoveBeforeDEI'10mg'NpHPzIaldDeOvfMW$  -Agree with echocardiogram  complete -Repeat EKG with chest pain, as needed SL nitro -Continue monitoring on telemetry while inpatient -will trend troponins, if declining and if patient continues to deny chest pain he is likely ok for discharge today and can follow up with Dr. Saralyn Pilar in 1 week for consideration of stress test  #CKD 3 Renal function is around patient's baseline with creatinine 1.55, GFR 51 (baseline from 04/2021 was 1.6, 54)  This patient's plan of care was discussed and created with Dr. Saralyn Pilar and he is in agreement.  Signed: Tristan Schroeder , PA-C 09/13/2021, 10:44 AM [facility] Cardiology

## 2021-09-13 NOTE — Assessment & Plan Note (Signed)
Complicates overall prognosis and care ?Lifestyle modification and exercise has been discussed with patient in detail ?

## 2021-09-13 NOTE — Consult Note (Signed)
ANTICOAGULATION CONSULT NOTE - Initial Consult ? ?Pharmacy Consult for heparin infusion ?Indication: chest pain/ACS ? ?No Known Allergies ? ?Patient Measurements: ?Height: 6\' 2"  (188 cm) ?Weight: (!) 138.3 kg (305 lb) ?IBW/kg (Calculated) : 82.2 ?Heparin Dosing Weight: 113.4 kg ? ?Vital Signs: ?Temp: 98.2 ?F (36.8 ?C) (03/02 0604) ?Temp Source: Oral (03/02 [id number]) ?BP: 151/88 (03/02 0700) ?Pulse Rate: 51 (03/02 0700) ? ?Labs: ?Recent Labs  ?  09/13/21 ?0610  ?HGB 13.6  ?HCT 41.9  ?PLT 225  ?CREATININE 1.55*  ?TROPONINIHS 38*  ? ? ?Estimated Creatinine Clearance: 75.9 mL/min (A) (by C-G formula based on SCr of 1.55 mg/dL (H)). ? ? ?Medical History: ?Past Medical History:  ?Diagnosis Date  ? Cervicalgia   ? Chicken pox   ? Coronary artery disease   ? High cholesterol   ? Hypertension   ? ? ?Medications:  ?No Prior anticoagulation noted  ? ?Assessment: ?61 y.o. male with medical history significant for coronary artery disease status post CABG, obesity, HTN, and HLD  presented  to the ER for evaluation of midsternal chest pain. Troponin I 38 > 45. Pharmacy has been consulted for heparin infusion management.  ? ?Goal of Therapy:  ?Heparin level 0.3-0.7 units/ml ?Monitor platelets by anticoagulation protocol: Yes ?  ?Plan:  ?Give 4000 units bolus x 1 ?Start heparin infusion at 1600 units/hr ?Check anti-Xa level in 6 hours and daily while on heparin ?Continue to monitor H&H and platelets ? ?Dorothe Pea, PharmD, BCPS ?Clinical Pharmacist   ?09/13/2021,7:14 AM ? ? ?

## 2021-09-13 NOTE — ED Triage Notes (Addendum)
Patient ambulatory to triage with steady gait, without difficulty or distress noted; pt reports around 3am began having upper CP and "tightness" in jaw that lasted approx 2hrs; denies hx of same ?

## 2021-09-13 NOTE — ED Provider Notes (Signed)
[facility] Provider Note    Event Date/Time   First MD Initiated Contact with Patient 09/13/21 [phone]     (approximate)   History   Chest Pain   HPI  Greg Lowe is a 61 y.o. male with history of hypertension, hyperlipidemia, obesity, family history of MI in his father at 84 years old, CABG in 2009 at [place], CKD who presents to the emergency department with complaints of central chest tightness with radiation into his jaw that started around 3 AM.  States he woke up and started having symptoms.  He thought maybe this was indigestion and tried drinking fluids without any relief.  States he called EMS who checked him out and called his primary care doctor who recommended he come to the ER.  States his symptoms have now resolved.  He took a full dose aspirin prior to arrival.  No associated shortness of breath, nausea, vomiting, diaphoresis, dizziness.  No fevers or cough.  No lower extremity swelling or pain.  No history of PE, DVT, exogenous estrogen use, recent fractures, surgery, trauma, hospitalization, prolonged travel or other immobilization. No lower extremity swelling or pain. No calf tenderness.  States he has not had a stress test or cardiac catheterization since his CABG.  He states when he had his CABG he was just feeling very tired with exertion but never had any chest discomfort.  He denies any known aggravating or alleviating factors.   History provided by patient and wife.    Past Medical History:  Diagnosis Date   Cervicalgia    Chicken pox    Coronary artery disease    High cholesterol    Hypertension     Past Surgical History:  Procedure Laterality Date   BYPASS GRAFT     triple   COLONOSCOPY WITH PROPOFOL N/A 09/19/2015   Procedure: COLONOSCOPY WITH PROPOFOL;  Surgeon: Christena Deem, MD;  Location[facility] ENDOSCOPY;  Service: Endoscopy;  Laterality: N/A;   CORONARY ARTERY BYPASS GRAFT     right forearm surgery      MEDICATIONS:   Prior to Admission medications   Medication Sig Start Date End Date Taking? Authorizing Provider  allopurinol (ZYLOPRIM) 100 MG tablet Take 300 mg by mouth daily.    [provider]  aspirin 325 MG tablet Take 325 mg by mouth daily.    [provider]  carvedilol (COREG) 25 MG tablet Take 50 mg by mouth 2 (two) times daily with a meal.     [provider]  ibuprofen (ADVIL,MOTRIN) 600 MG tablet Take 1 tablet (600 mg total) by mouth every 8 (eight) hours as needed. 05/30/18   Joni Reining, PA-C  Multiple Vitamin (MULTIVITAMIN) capsule Take 1 capsule by mouth daily.    [provider]  predniSONE (DELTASONE) 10 MG tablet Take 6 tablets on day 1, take 5 tablets on day 2, take 4 tablets on day 3, take 3 tablets on day 4, take 2 tablets on day 5, take 1 tablet on day 6 06/17/18   Enid Derry, PA-C  rosuvastatin (CRESTOR) 10 MG tablet Take 10 mg by mouth daily.    [provider]  saw palmetto 160 MG capsule Take 160 mg by mouth 2 (two) times daily.    [provider]    Physical Exam   Triage Vital Signs: ED Triage Vitals  Enc Vitals Group     BP 09/13/21 0604 (!) 169/95     Pulse Rate 09/13/21 0604 Marland Kitchen)  57     Resp 09/13/21 0604 20     Temp --      Temp src --      SpO2 09/13/21 0604 100 %     Weight 09/13/21 0551 (!) 305 lb (138.3 kg)     Height 09/13/21 0551 6\' 2"  (1.88 m)     Head Circumference --      Peak Flow --      Pain Score 09/13/21 0551 0     Pain Loc --      Pain Edu? --      Excl. in GC? --     Most recent vital signs: Vitals:   09/13/21 0604  BP: (!) 169/95  Pulse: (!) 57  Resp: 20  Temp: 98.2 F (36.8 C)  SpO2: 100%    CONSTITUTIONAL: Alert and oriented and responds appropriately to questions. Well-appearing; well-nourished, obese HEAD: Normocephalic, atraumatic EYES: Conjunctivae clear, pupils appear equal, sclera nonicteric ENT: normal nose; moist mucous membranes NECK: Supple, normal ROM CARD:  RRR; S1 and S2 appreciated; no murmurs, no clicks, no rubs, no gallops RESP: Normal chest excursion without splinting or tachypnea; breath sounds clear and equal bilaterally; no wheezes, no rhonchi, no rales, no hypoxia or respiratory distress, speaking full sentences ABD/GI: Normal bowel sounds; non-distended; soft, non-tender, no rebound, no guarding, no peritoneal signs BACK: The back appears normal EXT: Normal ROM in all joints; no deformity noted, no edema; no cyanosis, no calf tenderness or calf swelling SKIN: Normal color for age and race; warm; no rash on exposed skin NEURO: Moves all extremities equally, normal speech PSYCH: The patient's mood and manner are appropriate.   ED Results / Procedures / Treatments   LABS: (all labs ordered are listed, but only abnormal results are displayed) Labs Reviewed  BASIC METABOLIC PANEL - Abnormal; Notable for the following components:      Result Value   Glucose, Bld 112 (*)    BUN 35 (*)    Creatinine, Ser 1.55 (*)    GFR, Estimated 51 (*)    All other components within normal limits  TROPONIN I (HIGH SENSITIVITY) - Abnormal; Notable for the following components:   Troponin I (High Sensitivity) 38 (*)    All other components within normal limits  RESP PANEL BY RT-PCR (FLU A&B, COVID) ARPGX2  CBC  BRAIN NATRIURETIC PEPTIDE     EKG:  EKG Interpretation  Date/Time:  Thursday September 13 2021 06:03:17 EST Ventricular Rate:  53 PR Interval:  198 QRS Duration: 98 QT Interval:  419 QTC Calculation: 394 R Axis:   90 Text Interpretation: Sinus rhythm Borderline right axis deviation Confirmed by Rochele Raring [phone]) on 09/13/2021 6:19:44 AM         RADIOLOGY: My personal review and interpretation of imaging: X-ray shows cardiomegaly.  He has vascular congestion but no overt edema.  I have personally reviewed all radiology reports.   DG Chest Port 1 View  Result Date: 09/13/2021 CLINICAL DATA:  Chest pain. EXAM: PORTABLE CHEST 1  VIEW COMPARISON:  None. FINDINGS: 0613 hours. The lungs are clear without focal pneumonia, edema, pneumothorax or pleural effusion. There is pulmonary vascular congestion without overt pulmonary edema. The cardio pericardial silhouette is enlarged. Sternotomy wires noted over the region of the manubrium with 3 additional radiodensities projecting over the midline, indeterminate but likely related to prior surgery/treatment. IMPRESSION: Enlargement of the cardiopericardial silhouette with pulmonary vascular congestion. 3 radiodense structures overlie the midline. Correlation for thoracic spine surgery recommended. Lateral chest  x-ray could be used to further evaluate as clinically warranted. Electronically Signed   By: Kennith Center M.D.   On: 09/13/2021 06:31     PROCEDURES:  Critical Care performed: Yes, see critical care procedure note(s)   CRITICAL CARE Performed by: Baxter Hire Tevan Marian   Total critical care time: 45 minutes  Critical care time was exclusive of separately billable procedures and treating other patients.  Critical care was necessary to treat or prevent imminent or life-threatening deterioration.  Critical care was time spent personally by me on the following activities: development of treatment plan with patient and/or surrogate as well as nursing, discussions with consultants, evaluation of patient's response to treatment, examination of patient, obtaining history from patient or surrogate, ordering and performing treatments and interventions, ordering and review of laboratory studies, ordering and review of radiographic studies, pulse oximetry and re-evaluation of patient's condition.   Marland Kitchen1-3 Lead EKG Interpretation Performed by: Ugo Thoma, Layla Maw, DO Authorized by: Copeland Neisen, Layla Maw, DO     Interpretation: normal     ECG rate:  65   ECG rate assessment: normal     Rhythm: sinus rhythm     Ectopy: none     Conduction: normal      IMPRESSION / MDM / ASSESSMENT AND PLAN / ED  COURSE  I reviewed the triage vital signs and the nursing notes.    Patient here with chest and jaw tightness that has resolved.  Does have significant cardiac history.  The patient is on the cardiac monitor to evaluate for evidence of arrhythmia and/or significant heart rate changes.   DIFFERENTIAL DIAGNOSIS (includes but not limited to):   ACS, PE, dissection, pneumonia, CHF, pneumothorax, GERD, musculoskeletal chest pain   PLAN: We will obtain cardiac labs with CBC, BMP, troponin x2.  Will obtain chest x-ray.  EKG shows no ischemic change.  Will give nitroglycerin as needed.  Patient on cardiac monitoring.  Low threshold for admission given his previous history.   MEDICATIONS GIVEN IN ED: Medications  nitroGLYCERIN (NITROSTAT) SL tablet 0.4 mg (has no administration in time range)     ED COURSE: Patient's labs show normal hemoglobin.  He has chronic kidney disease which is stable compared to labs in 2016.  Normal electrolytes.  His first troponin has come back elevated at 38.  No old for comparison but given his risk factors and symptoms, I am concerned for possible NSTEMI and will start heparin.  He is currently asymptomatic.  Chest x-ray reviewed by myself and radiologist and shows cardiomegaly, pulmonary vascular congestion but no overt edema.  Will discuss with medicine for admission.   CONSULTS:  Consulted and discussed patient's case with hospitalist, Dr. Joylene Igo.  I have recommended admission and consulting physician agrees and will place admission orders.  Patient (and family if present) agree with this plan.   I reviewed all nursing notes, vitals, pertinent previous records.  All labs, EKGs, imaging ordered have been independently reviewed and interpreted by myself.    OUTSIDE RECORDS REVIEWED: Reviewed patient's last office visit with Bethann Punches on 04/26/2021.         FINAL CLINICAL IMPRESSION(S) / ED DIAGNOSES   Final diagnoses:  NSTEMI (non-ST elevated  myocardial infarction) (HCC)     Rx / DC Orders   ED Discharge Orders     None        Note:  This document was prepared using Dragon voice recognition software and may include unintentional dictation errors.   Keiland Pickering, Layla Maw, DO 09/13/21  0709 ° °

## 2021-09-13 NOTE — Progress Notes (Signed)
*  PRELIMINARY RESULTS* ?Echocardiogram ?2D Echocardiogram has been performed. ? ?Greg Lowe, [name] ?09/13/2021, 3:36 PM ?

## 2021-09-13 NOTE — ED Notes (Signed)
IV team consult placed, admitting MD aware.  ?

## 2021-09-13 NOTE — H&P (Signed)
?History and Physical  ? ? ?Patient: Greg Lowe OEV:[id number] DOB: [birth year] ?DOA: 09/13/2021 ?DOS: the patient was seen and examined on 09/13/2021 ?PCP: Danella Penton, MD  ?Patient coming from: Home ? ?Chief Complaint:  ?Chief Complaint  ?Patient presents with  ? Chest Pain  ? ? ?HPI: Greg Lowe is a 61 y.o. male with medical history significant for coronary artery disease status post CABG, obesity, hypertension and dyslipidemia who presents to the ER via private vehicle for evaluation of midsternal chest pain that happened at about 3 AM with radiation to his jaw.  He states that he initially thought his symptoms were due to indigestion and tried drinking fluids without any relief.  He called his primary care physician who advised him to go to the ER.  He took 1 full dose aspirin at home the time he arrived to ER his pain was resolved. ?He denied having any nausea, no vomiting, no diaphoresis, no palpitations, no shortness of breath, no fever, no chills, no cough, no abdominal pain, no urinary symptoms no changes in his bowel habits. ?Patient has not had a stress test since his CABG in 2009. ? ?Review of Systems: As mentioned in the history of present illness. All other systems reviewed and are negative. ?Past Medical History:  ?Diagnosis Date  ? Cervicalgia   ? Chicken pox   ? Coronary artery disease   ? High cholesterol   ? Hypertension   ? ?Past Surgical History:  ?Procedure Laterality Date  ? BYPASS GRAFT    ? triple  ? COLONOSCOPY WITH PROPOFOL N/A 09/19/2015  ? Procedure: COLONOSCOPY WITH PROPOFOL;  Surgeon: Christena Deem, MD;  Location: [facility] ENDOSCOPY;  Service: Endoscopy;  Laterality: N/A;  ? CORONARY ARTERY BYPASS GRAFT    ? right forearm surgery    ? ?Social History:  reports that he has never smoked. He has never used smokeless tobacco. He reports that he does not drink alcohol and does not use drugs. ? ?No Known Allergies ? ?No family history on file. ? ?Prior to Admission medications    ?Medication Sig Start Date End Date Taking? Authorizing Provider  ?allopurinol (ZYLOPRIM) 300 MG tablet Take 300 mg by mouth daily. 06/25/21  Yes [provider]  ?amLODipine (NORVASC) 5 MG tablet Take 5 mg by mouth daily. 07/30/21  Yes [provider]  ?aspirin 325 MG tablet Take 325 mg by mouth daily.   Yes [provider]  ?carvedilol (COREG) 25 MG tablet Take 50 mg by mouth 2 (two) times daily with a meal.    Yes [provider]  ?hydrochlorothiazide (HYDRODIURIL) 25 MG tablet Take 25 mg by mouth daily.   Yes [provider]  ?Multiple Vitamin (MULTIVITAMIN) capsule Take 1 capsule by mouth daily.   Yes [provider]  ?rosuvastatin (CRESTOR) 10 MG tablet Take 10 mg by mouth daily.   Yes [provider]  ?saw palmetto 160 MG capsule Take 160 mg by mouth 2 (two) times daily.   Yes [provider]  ?allopurinol (ZYLOPRIM) 100 MG tablet Take 300 mg by mouth daily. ?Patient not taking: Reported on 09/13/2021    [provider]  ?colchicine 0.6 MG tablet Take 0.6 mg by mouth 2 (two) times daily. 06/20/21   [provider]  ?ibuprofen (ADVIL,MOTRIN) 600 MG tablet Take 1 tablet (600 mg total) by mouth every 8 (eight) hours as needed. 05/30/18   Joni Reining, PA-C  ?naproxen (NAPROSYN) 500 MG tablet Take 500 mg by  mouth 2 (two) times daily. 08/20/21   [provider]  ?predniSONE (DELTASONE) 10 MG tablet Take 6 tablets on day 1, take 5 tablets on day 2, take 4 tablets on day 3, take 3 tablets on day 4, take 2 tablets on day 5, take 1 tablet on day 6 ?Patient not taking: Reported on 09/13/2021 06/17/18   Enid Derry, PA-C  ? ? ?Physical Exam: ?Vitals:  ? 09/13/21 0551 09/13/21 0604 09/13/21 0700 09/13/21 0730  ?BP:  (!) 169/95 (!) 151/88 (!) 156/83  ?Pulse:  (!) 57 (!) 51   ?Resp:  20 16 15   ?Temp:  98.2 ?F (36.8 ?C)    ?TempSrc:  Oral    ?SpO2:  100% 96% 97%  ?Weight: (!) 138.3 kg     ?Height: 6\' 2"  (1.88 m)     ? ?Physical  Exam ?Vitals and nursing note reviewed.  ?Constitutional:   ?   Appearance: He is obese.  ?HENT:  ?   Head: Normocephalic and atraumatic.  ?Eyes:  ?   Pupils: Pupils are equal, round, and reactive to light.  ?Cardiovascular:  ?   Rate and Rhythm: Bradycardia present.  ?   Heart sounds: Normal heart sounds.  ?Pulmonary:  ?   Effort: Pulmonary effort is normal.  ?Abdominal:  ?   General: Bowel sounds are normal.  ?   Palpations: Abdomen is soft.  ?Musculoskeletal:     ?   General: Normal range of motion.  ?   Cervical back: Normal range of motion and neck supple.  ?Skin: ?   General: Skin is warm and dry.  ?Neurological:  ?   General: No focal deficit present.  ?   Mental Status: He is alert and oriented to person, place, and time.  ?Psychiatric:     ?   Mood and Affect: Mood normal.     ?   Behavior: Behavior normal.  ? ? ? ?Data Reviewed: ?Data Reviewed: ?Relevant notes from primary care and specialist visits, past discharge summaries as available in EHR, including Care Everywhere. ?Prior diagnostic testing as pertinent to current admission diagnoses ?Updated medications and problem lists for reconciliation ?ED course, including vitals, labs, imaging, treatment and response to treatment ?Triage notes, nursing and pharmacy notes and ED provider's notes ?Notable results as noted in HPI ?Labs reviewed BUN 35, creatinine 1.5, troponin 38 >> 45 ?Chest x-ray reviewed by me shows no evidence of cardiopulmonary disease ?Twelve-lead EKG reviewed by me shows sinus rhythm with right axis deviation ?There are no new results to review at this time. ? ?Assessment and Plan: ?* ACS (acute coronary syndrome) (HCC)- (present on admission) ?Patient with a known history of coronary artery disease status post CABG in 2009 who presents to the ER for evaluation of midsternal chest pain with radiation to the jaw. ?He has an uptrending troponin level but is currently chest pain-free. ?Continue heparin drip initiated in the ER. ?Continue  aspirin, beta-blockers and statins. ?Obtain 2D echocardiogram to assess LVEF and rule out regional wall motion abnormality. ?We will request cardiology consult ? ?Gout- (present on admission) ?Stable ?Not acutely exacerbated ?Continue allopurinol and as needed colchicine ? ?Obesity (BMI 30-39.9)- (present on admission) ?Complicates overall prognosis and care ?Lifestyle modification and exercise has been discussed with patient in detail ? ?Hypertension- (present on admission) ?Blood pressure stable ?Continue amlodipine, hydrochlorothiazide and carvedilol ? ? ? ? ? ? ?Advance Care Planning:   Code Status: Full Code  ? ?Consults: Cardiology ? ?Family Communication: Greater than 50%  of time was spent discussing patient's condition and plan of care with him at the bedside.  All questions and concerns have been addressed.  He verbalizes understanding and agrees with the plan. ? ?Severity of Illness: ?The appropriate patient status for this patient is OBSERVATION. Observation status is judged to be reasonable and necessary in order to provide the required intensity of service to ensure the patient's safety. The patient's presenting symptoms, physical exam findings, and initial radiographic and laboratory data in the context of their medical condition is felt to place them at decreased risk for further clinical deterioration. Furthermore, it is anticipated that the patient will be medically stable for discharge from the hospital within 2 midnights of admission.  ? ?Author: ?Lucile Shutters, MD ?09/13/2021 8:39 AM ? ?For on call review [email or web address].  ?

## 2021-09-13 NOTE — Assessment & Plan Note (Signed)
Stable ?Not acutely exacerbated ?Continue allopurinol and as needed colchicine ?

## 2021-09-13 NOTE — ED Notes (Signed)
Pt resting at this time. Pt requested to use restroom, pt ambulatory with steady gait. This RN looked or PIV with no success, another RN to look at this time. Pt denies any chest pain at this time. Pt denies SOB or N/V. Call bell in reach.  ?

## 2021-09-13 NOTE — Assessment & Plan Note (Signed)
Blood pressure stable ?Continue amlodipine, hydrochlorothiazide and carvedilol ?

## 2021-09-13 NOTE — Discharge Instructions (Signed)
Follow-up with Dr. Saralyn Pilar in 1 week for stress test ?

## 2022-09-16 DIAGNOSIS — M5010 Cervical disc disorder with radiculopathy, unspecified cervical region: Secondary | ICD-10-CM | POA: Diagnosis not present

## 2022-11-06 DIAGNOSIS — R7309 Other abnormal glucose: Secondary | ICD-10-CM | POA: Diagnosis not present

## 2022-11-06 DIAGNOSIS — M1A00X0 Idiopathic chronic gout, unspecified site, without tophus (tophi): Secondary | ICD-10-CM | POA: Diagnosis not present

## 2022-11-11 DIAGNOSIS — N1831 Chronic kidney disease, stage 3a: Secondary | ICD-10-CM | POA: Diagnosis not present

## 2022-11-11 DIAGNOSIS — R739 Hyperglycemia, unspecified: Secondary | ICD-10-CM | POA: Diagnosis not present

## 2022-11-11 DIAGNOSIS — M1A00X0 Idiopathic chronic gout, unspecified site, without tophus (tophi): Secondary | ICD-10-CM | POA: Diagnosis not present

## 2022-12-17 DIAGNOSIS — J40 Bronchitis, not specified as acute or chronic: Secondary | ICD-10-CM | POA: Diagnosis not present

## 2022-12-17 DIAGNOSIS — J45902 Unspecified asthma with status asthmaticus: Secondary | ICD-10-CM | POA: Diagnosis not present

## 2023-02-04 ENCOUNTER — Ambulatory Visit
Admission: RE | Admit: 2023-02-04 | Discharge: 2023-02-04 | Disposition: A | Discharge status: Home / Self Care | Payer: 59 | Source: Ambulatory Visit | Attending: Internal Medicine | Admitting: Internal Medicine

## 2023-02-04 ENCOUNTER — Other Ambulatory Visit: Payer: Self-pay | Admitting: Internal Medicine

## 2023-02-04 DIAGNOSIS — K352 Acute appendicitis with generalized peritonitis, without perforation or abscess: Secondary | ICD-10-CM | POA: Diagnosis not present

## 2023-02-04 DIAGNOSIS — R1031 Right lower quadrant pain: Secondary | ICD-10-CM

## 2023-02-04 DIAGNOSIS — K35891 Other acute appendicitis without perforation, with gangrene: Secondary | ICD-10-CM | POA: Diagnosis present

## 2023-05-15 DIAGNOSIS — Z0000 Encounter for general adult medical examination without abnormal findings: Secondary | ICD-10-CM | POA: Diagnosis not present

## 2023-05-15 DIAGNOSIS — Z125 Encounter for screening for malignant neoplasm of prostate: Secondary | ICD-10-CM | POA: Diagnosis not present

## 2023-05-19 DIAGNOSIS — I2510 Atherosclerotic heart disease of native coronary artery without angina pectoris: Secondary | ICD-10-CM | POA: Diagnosis not present

## 2023-05-19 DIAGNOSIS — R739 Hyperglycemia, unspecified: Secondary | ICD-10-CM | POA: Diagnosis not present

## 2023-05-19 DIAGNOSIS — M1A00X0 Idiopathic chronic gout, unspecified site, without tophus (tophi): Secondary | ICD-10-CM | POA: Diagnosis not present

## 2023-05-19 DIAGNOSIS — R972 Elevated prostate specific antigen [PSA]: Secondary | ICD-10-CM | POA: Diagnosis not present

## 2023-05-19 DIAGNOSIS — Z0000 Encounter for general adult medical examination without abnormal findings: Secondary | ICD-10-CM | POA: Diagnosis not present

## 2023-05-19 DIAGNOSIS — E782 Mixed hyperlipidemia: Secondary | ICD-10-CM | POA: Diagnosis not present

## 2023-12-15 ENCOUNTER — Other Ambulatory Visit: Payer: Self-pay | Admitting: Internal Medicine

## 2023-12-15 DIAGNOSIS — R972 Elevated prostate specific antigen [PSA]: Secondary | ICD-10-CM

## 2023-12-24 ENCOUNTER — Ambulatory Visit
Admission: RE | Admit: 2023-12-24 | Discharge: 2023-12-24 | Disposition: A | Discharge status: Home / Self Care | Payer: PRIVATE HEALTH INSURANCE | Source: Ambulatory Visit | Attending: Internal Medicine | Admitting: Internal Medicine

## 2023-12-24 DIAGNOSIS — R972 Elevated prostate specific antigen [PSA]: Secondary | ICD-10-CM | POA: Insufficient documentation

## 2023-12-24 MED ORDER — GADOBUTROL 1 MMOL/ML IV SOLN
10.0000 mL | Freq: Once | INTRAVENOUS | Status: AC | PRN
  Administered 2023-12-24: 10 mL via INTRAVENOUS

## 2024-02-17 IMAGING — DX DG CHEST 1V PORT
1 series · 1 of 1 positions shown · non-contrast
Comparison: None.

CLINICAL DATA: Chest pain.

EXAM:
PORTABLE CHEST 1 VIEW

[chest ap]
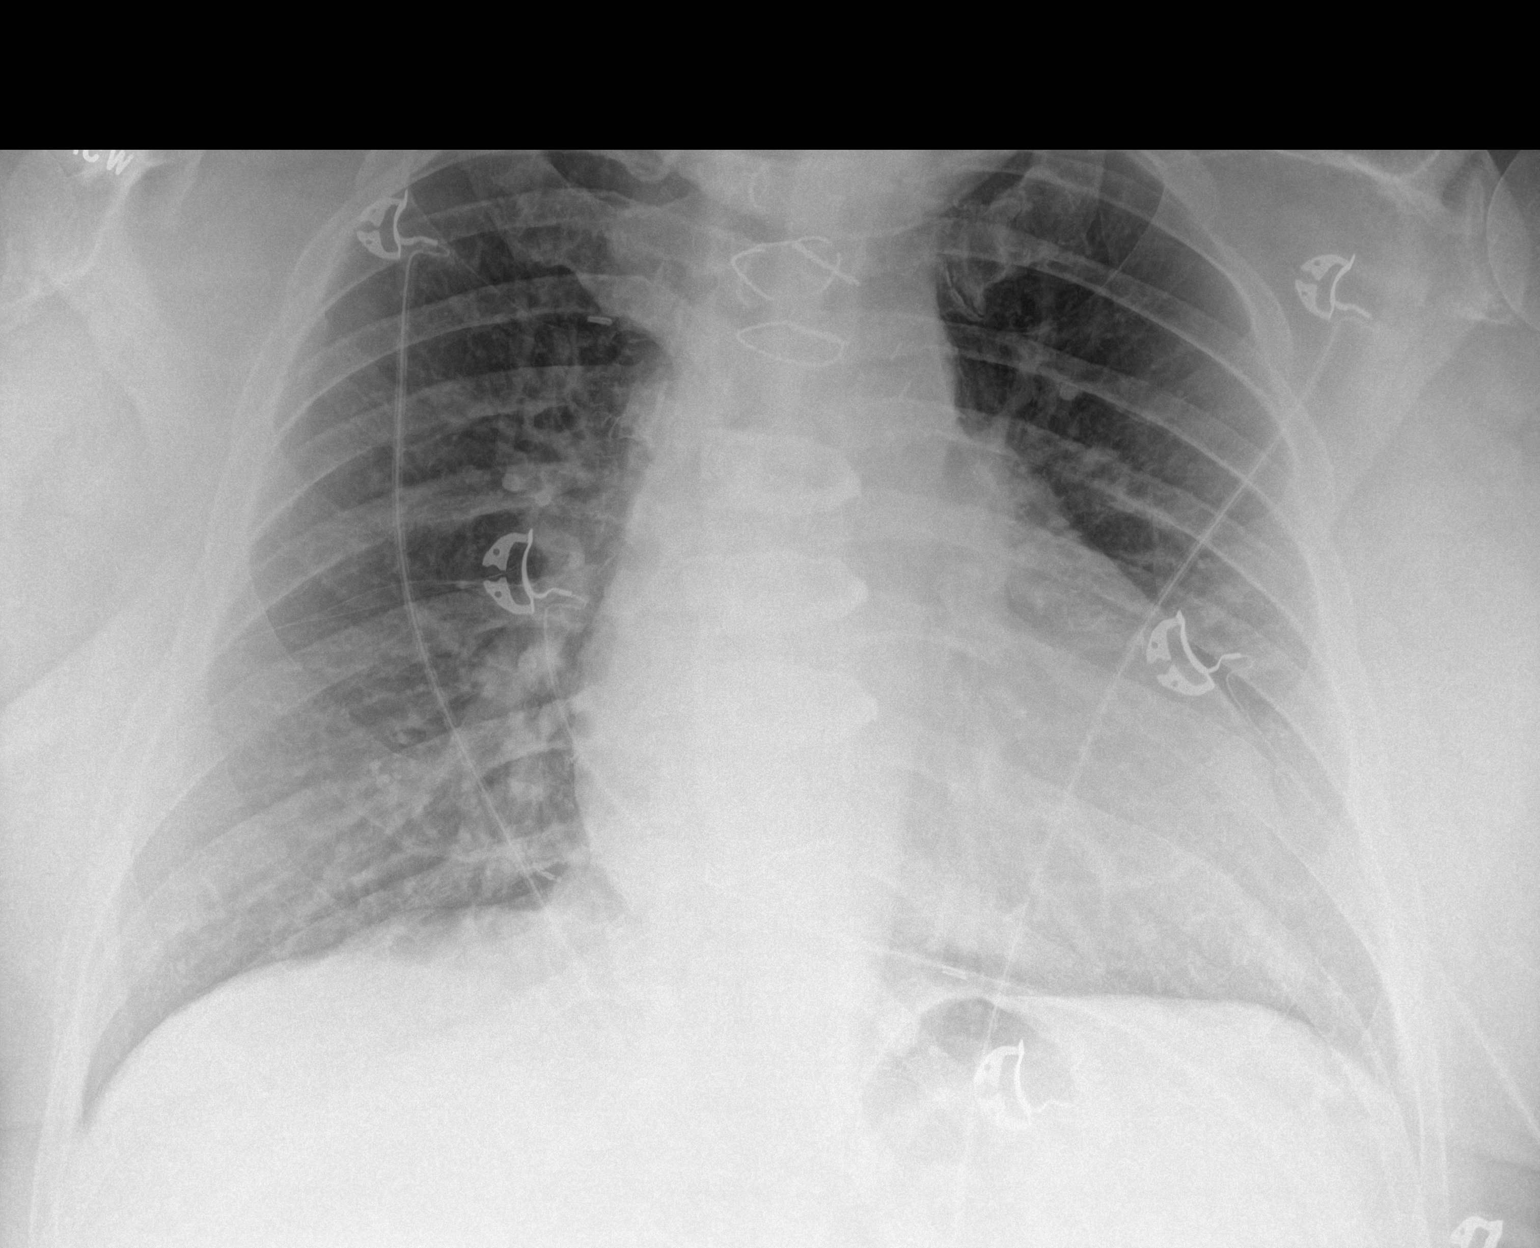

[1 of 1 positions shown; findings below may reference images not displayed]

FINDINGS: 0841 hours. The lungs are clear without focal pneumonia, edema,
pneumothorax or pleural effusion. There is pulmonary vascular
congestion without overt pulmonary edema. The cardio pericardial
silhouette is enlarged. Sternotomy wires noted over the region of
the manubrium with 3 additional radiodensities projecting over the
midline, indeterminate but likely related to prior
surgery/treatment.
IMPRESSION: Enlargement of the cardiopericardial silhouette with pulmonary
vascular congestion.

3 radiodense structures overlie the midline. Correlation for
thoracic spine surgery recommended. Lateral chest x-ray could be
used to further evaluate as clinically warranted.
